# Patient Record
Sex: Female | Born: 1960 | Race: White | Hispanic: No | Marital: Single | Smoking: Light tobacco smoker
Health system: Southern US, Community
[De-identification: ages and names within clinical notes are randomized; demographics above are authoritative.]

## PROBLEM LIST (undated history)

## (undated) DIAGNOSIS — M5136 Other intervertebral disc degeneration, lumbar region: Secondary | ICD-10-CM

## (undated) DIAGNOSIS — G8929 Other chronic pain: Secondary | ICD-10-CM

## (undated) DIAGNOSIS — K219 Gastro-esophageal reflux disease without esophagitis: Secondary | ICD-10-CM

## (undated) DIAGNOSIS — F411 Generalized anxiety disorder: Secondary | ICD-10-CM

## (undated) DIAGNOSIS — Z8679 Personal history of other diseases of the circulatory system: Secondary | ICD-10-CM

## (undated) DIAGNOSIS — M51369 Other intervertebral disc degeneration, lumbar region without mention of lumbar back pain or lower extremity pain: Secondary | ICD-10-CM

## (undated) DIAGNOSIS — E119 Type 2 diabetes mellitus without complications: Secondary | ICD-10-CM

## (undated) DIAGNOSIS — M7541 Impingement syndrome of right shoulder: Secondary | ICD-10-CM

## (undated) DIAGNOSIS — E785 Hyperlipidemia, unspecified: Secondary | ICD-10-CM

## (undated) DIAGNOSIS — M2550 Pain in unspecified joint: Secondary | ICD-10-CM

## (undated) DIAGNOSIS — Z720 Tobacco use: Secondary | ICD-10-CM

## (undated) DIAGNOSIS — M25562 Pain in left knee: Secondary | ICD-10-CM

## (undated) DIAGNOSIS — I10 Essential (primary) hypertension: Secondary | ICD-10-CM

## (undated) DIAGNOSIS — K589 Irritable bowel syndrome without diarrhea: Secondary | ICD-10-CM

## (undated) DIAGNOSIS — I872 Venous insufficiency (chronic) (peripheral): Secondary | ICD-10-CM

## (undated) DIAGNOSIS — M542 Cervicalgia: Secondary | ICD-10-CM

## (undated) DIAGNOSIS — Z8701 Personal history of pneumonia (recurrent): Secondary | ICD-10-CM

## (undated) DIAGNOSIS — Z1211 Encounter for screening for malignant neoplasm of colon: Secondary | ICD-10-CM

## (undated) DIAGNOSIS — G894 Chronic pain syndrome: Secondary | ICD-10-CM

## (undated) HISTORY — DX: Other chronic pain: G89.29

## (undated) HISTORY — DX: Generalized anxiety disorder: F41.1

## (undated) HISTORY — DX: Other intervertebral disc degeneration, lumbar region without mention of lumbar back pain or lower extremity pain: M51.369

## (undated) HISTORY — DX: Tobacco use: Z72.0

## (undated) HISTORY — PX: COLONOSCOPY: SHX174

## (undated) HISTORY — DX: Type 2 diabetes mellitus without complications: E11.9

## (undated) HISTORY — DX: Essential (primary) hypertension: I10

## (undated) HISTORY — DX: Cervicalgia: M54.2

## (undated) HISTORY — DX: Irritable bowel syndrome, unspecified: K58.9

## (undated) HISTORY — DX: Impingement syndrome of right shoulder: M75.41

## (undated) HISTORY — DX: Chronic pain syndrome: G89.4

## (undated) HISTORY — DX: Venous insufficiency (chronic) (peripheral): I87.2

## (undated) HISTORY — DX: Personal history of other diseases of the circulatory system: Z86.79

## (undated) HISTORY — DX: Encounter for screening for malignant neoplasm of colon: Z12.11

## (undated) HISTORY — DX: Other intervertebral disc degeneration, lumbar region: M51.36

## (undated) HISTORY — DX: Personal history of pneumonia (recurrent): Z87.01

## (undated) HISTORY — DX: Pain in unspecified joint: M25.50

## (undated) HISTORY — DX: Gastro-esophageal reflux disease without esophagitis: K21.9

## (undated) HISTORY — DX: Pain in left knee: M25.562

## (undated) HISTORY — PX: ESOPHAGOGASTRODUODENOSCOPY: SHX1529

## (undated) HISTORY — DX: Hyperlipidemia, unspecified: E78.5

---

## 1991-04-26 HISTORY — PX: OVARY SURGERY: SHX727

## 2001-04-25 HISTORY — PX: VAGINAL HYSTERECTOMY: SHX2639

## 2003-09-23 ENCOUNTER — Other Ambulatory Visit: Admission: RE | Admit: 2003-09-23 | Discharge: 2003-09-23 | Payer: Self-pay | Admitting: Obstetrics and Gynecology

## 2003-10-01 ENCOUNTER — Encounter: Admission: RE | Admit: 2003-10-01 | Discharge: 2003-10-01 | Payer: Self-pay | Admitting: Obstetrics and Gynecology

## 2006-01-17 ENCOUNTER — Ambulatory Visit: Payer: Self-pay | Admitting: Gastroenterology

## 2006-01-25 ENCOUNTER — Ambulatory Visit: Payer: Self-pay | Admitting: Gastroenterology

## 2006-03-10 ENCOUNTER — Encounter (INDEPENDENT_AMBULATORY_CARE_PROVIDER_SITE_OTHER): Payer: Self-pay | Admitting: *Deleted

## 2006-03-10 ENCOUNTER — Ambulatory Visit: Payer: Self-pay | Admitting: Gastroenterology

## 2006-04-11 ENCOUNTER — Ambulatory Visit: Payer: Self-pay | Admitting: Gastroenterology

## 2008-04-25 DIAGNOSIS — Z8701 Personal history of pneumonia (recurrent): Secondary | ICD-10-CM

## 2008-04-25 HISTORY — DX: Personal history of pneumonia (recurrent): Z87.01

## 2009-02-02 ENCOUNTER — Emergency Department (HOSPITAL_COMMUNITY): Admission: EM | Admit: 2009-02-02 | Discharge: 2009-02-03 | Payer: Self-pay | Admitting: Emergency Medicine

## 2009-03-14 ENCOUNTER — Encounter: Payer: Self-pay | Admitting: Pulmonary Disease

## 2009-03-16 ENCOUNTER — Emergency Department (HOSPITAL_COMMUNITY): Admission: EM | Admit: 2009-03-16 | Discharge: 2009-03-16 | Payer: Self-pay | Admitting: Emergency Medicine

## 2009-03-17 ENCOUNTER — Ambulatory Visit: Payer: Self-pay | Admitting: Pulmonary Disease

## 2009-03-17 DIAGNOSIS — J309 Allergic rhinitis, unspecified: Secondary | ICD-10-CM | POA: Insufficient documentation

## 2009-04-01 ENCOUNTER — Ambulatory Visit: Payer: Self-pay | Admitting: Pulmonary Disease

## 2009-04-08 LAB — CONVERTED CEMR LAB: Pap Smear: NORMAL

## 2009-04-25 HISTORY — PX: CHOLECYSTECTOMY: SHX55

## 2009-05-14 ENCOUNTER — Ambulatory Visit: Payer: Self-pay | Admitting: Internal Medicine

## 2009-05-14 DIAGNOSIS — K219 Gastro-esophageal reflux disease without esophagitis: Secondary | ICD-10-CM | POA: Insufficient documentation

## 2010-05-25 NOTE — Assessment & Plan Note (Signed)
Summary: new / bcbs / #/ cd   Primary Care Provider:  Etta Grandchild MD   History of Present Illness: New to me she complains of nasal allergies.  Dyspepsia History:      She has no alarm features of dyspepsia including no history of melena, hematochezia, dysphagia, persistent vomiting, or involuntary weight loss > 5%.  There is a prior history of GERD.  The patient does not have a prior history of documented ulcer disease.  The dominant symptom is heartburn or acid reflux.  An H-2 blocker medication is currently being taken.  She notes that the symptoms have improved with the H-2 blocker therapy.  Symptoms have not persisted after 4 weeks of H-2 blocker treatment.  A prior EGD has been done which showed moderate or severe esophagitis.     Preventive Screening-Counseling & Management  Alcohol-Tobacco     Alcohol drinks/day: <1     Alcohol type: wine     >5/day in last 3 mos: no     Alcohol Counseling: not indicated; use of alcohol is not excessive or problematic     Feels need to cut down: no     Feels annoyed by complaints: no     Feels guilty re: drinking: no     Needs 'eye opener' in am: no     Smoking Status: current     Smoking Cessation Counseling: yes     Smoke Cessation Stage: ready     Packs/Day: 0.25     Year Started: 1980     Pack years: 30  Caffeine-Diet-Exercise     Does Patient Exercise: yes  Hep-HIV-STD-Contraception     Hepatitis Risk: no risk noted     HIV Risk: no risk noted     STD Risk: no risk noted      Drug Use:  no.    Medications Prior to Update: 1)  Alprazolam 0.25 Mg Tabs (Alprazolam) .... As Needed 2)  Vitamin B6 and B12 .... Take 1 Tablet By Mouth Once A Day 3)  Vitamin D .... Take 1 Tablet By Mouth Once A Day 4)  Multivitamin .... Take 1 Tablet By Mouth Once A Day 5)  Chantix Starting Month Pak 0.5 Mg X 11 & 1 Mg X 42  Misc (Varenicline Tartrate) .... Take As Directed---Refills Are To Be For The Continuing Pak  Current Medications  (verified): 1)  Alprazolam 0.25 Mg Tabs (Alprazolam) .... As Needed 2)  Vitamin B6 and B12 .... Take 1 Tablet By Mouth Once A Day 3)  Vitamin D .... Take 1 Tablet By Mouth Once A Day 4)  Multivitamin .... Take 1 Tablet By Mouth Once A Day 5)  Chantix Starting Month Pak 0.5 Mg X 11 & 1 Mg X 42  Misc (Varenicline Tartrate) .... Take As Directed---Refills Are To Be For The Continuing Pak 6)  Nasacort Aq 55 Mcg/act Aers (Triamcinolone Acetonide(Nasal)) .... 2 Puffs Each Nostril Once Daily  Allergies (verified): No Known Drug Allergies  Past History:  Past Medical History: Allergic Rhinitis GERD  Family History: Reviewed history from 03/17/2009 and no changes required. Rheumatism: mother Family History of Alcoholism/Addiction Family History of Arthritis  Social History: Reviewed history from 03/17/2009 and no changes required. Patient is a current smoker.  started at age 36.  1/2 ppd. Pt is currently on a patch . pt is single. pt works in Airline pilot.  Alcohol use-no Drug use-no Regular exercise-yes Packs/Day:  0.25 Hepatitis Risk:  no risk noted HIV Risk:  no risk noted STD Risk:  no risk noted Drug Use:  no Does Patient Exercise:  yes  Review of Systems  The patient denies fever, chest pain, prolonged cough, headaches, hemoptysis, abdominal pain, suspicious skin lesions, enlarged lymph nodes, and angioedema.   ENT:  Complains of nasal congestion and postnasal drainage; denies difficulty swallowing, ear discharge, earache, ringing in ears, sinus pressure, and sore throat.  Physical Exam  General:  alert, well-developed, well-nourished, well-hydrated, and overweight-appearing.   Head:  normocephalic, atraumatic, no abnormalities observed, and no abnormalities palpated.   Eyes:  vision grossly intact, pupils equal, pupils round, and pupils reactive to light.   Ears:  R ear normal and L ear normal.   Nose:  nasal discharge, mucosal erythema, and mucosal edema.  no external  deformity, no airflow obstruction, no intranasal foreign body, no nasal polyps, no nasal mucosal lesions, no mucosal friability, no active bleeding or clots, no sinus percussion tenderness, no septum abnormalities, nasal dischargemucosal pallor, mucosal erythema, and mucosal edema.   Mouth:  Oral mucosa and oropharynx without lesions or exudates.  Teeth in good repair. Neck:  No deformities, masses, or tenderness noted. Lungs:  Normal respiratory effort, chest expands symmetrically. Lungs are clear to auscultation, no crackles or wheezes. Heart:  Normal rate and regular rhythm. S1 and S2 normal without gallop, murmur, click, rub or other extra sounds. Abdomen:  Bowel sounds positive,abdomen soft and non-tender without masses, organomegaly or hernias noted. Msk:  normal ROM, no joint tenderness, no joint swelling, no joint warmth, and no redness over joints.   Pulses:  R and L carotid,radial,femoral,dorsalis pedis and posterior tibial pulses are full and equal bilaterally Extremities:  No clubbing, cyanosis, edema, or deformity noted with normal full range of motion of all joints.   Neurologic:  No cranial nerve deficits noted. Station and gait are normal. Plantar reflexes are down-going bilaterally. DTRs are symmetrical throughout. Sensory, motor and coordinative functions appear intact. Skin:  turgor normal, no rashes, no suspicious lesions, no ecchymoses, no petechiae, no purpura, no ulcerations, no edema, excessive tan, and solar damage.   Cervical Nodes:  no anterior cervical adenopathy and no posterior cervical adenopathy.   Axillary Nodes:  no R axillary adenopathy and no L axillary adenopathy.   Inguinal Nodes:  no R inguinal adenopathy and no L inguinal adenopathy.   Psych:  Cognition and judgment appear intact. Alert and cooperative with normal attention span and concentration. No apparent delusions, illusions, hallucinations   Impression & Recommendations:  Problem # 1:  GERD  (ICD-530.81) Assessment Improved  Her updated medication list for this problem includes:    Protonix 40 Mg Tbec (Pantoprazole sodium) ..... One by mouth once daily for heartburn  Problem # 2:  ALLERGIC RHINITIS (ICD-477.9) Assessment: Deteriorated  Her updated medication list for this problem includes:    Nasacort Aq 55 Mcg/act Aers (Triamcinolone acetonide(nasal)) .Marland Kitchen... 2 puffs each nostril once daily  Complete Medication List: 1)  Alprazolam 0.25 Mg Tabs (Alprazolam) .... As needed 2)  Vitamin B6 and B12  .... Take 1 tablet by mouth once a day 3)  Vitamin D  .... Take 1 tablet by mouth once a day 4)  Multivitamin  .... Take 1 tablet by mouth once a day 5)  Chantix Starting Month Pak 0.5 Mg X 11 & 1 Mg X 42 Misc (Varenicline tartrate) .... Take as directed---refills are to be for the continuing pak 6)  Nasacort Aq 55 Mcg/act Aers (Triamcinolone acetonide(nasal)) .... 2 puffs each nostril once  daily 7)  Protonix 40 Mg Tbec (Pantoprazole sodium) .... One by mouth once daily for heartburn  Colorectal Screening:  Colonoscopy Results:    Date of Exam: 06/09/2004    Results: Normal  PAP Screening:    Hx Cervical Dysplasia in last 5 yrs? No    3 normal PAP smears in last 5 yrs? Yes    Last PAP smear:  04/08/2009  PAP Smear Results:    Date of Exam:  04/08/2009    Results:  Normal  Mammogram Screening:    Last Mammogram:  04/08/2009  Mammogram Results:    Date of Exam:  04/08/2009    Results:  Normal Bilateral  Osteoporosis Risk Assessment:  Risk Factors for Fracture or Low Bone Density:   Race (White or Asian):     yes   Smoking status:       current  Patient Instructions: 1)  Please schedule a follow-up appointment as needed. 2)  Tobacco is very bad for your health and your loved ones! You Should stop smoking!. 3)  Stop Smoking Tips: Choose a Quit date. Cut down before the Quit date. decide what you will do as a substitute when you feel the urge to  smoke(gum,toothpick,exercise). 4)  It is important that you exercise regularly at least 20 minutes 5 times a week. If you develop chest pain, have severe difficulty breathing, or feel very tired , stop exercising immediately and seek medical attention. 5)  You need to lose weight. Consider a lower calorie diet and regular exercise.  Prescriptions: PROTONIX 40 MG TBEC (PANTOPRAZOLE SODIUM) one by mouth once daily for heartburn  #30 x 11   Entered and Authorized by:   Etta Grandchild MD   Signed by:   Etta Grandchild MD on 05/14/2009   Method used:   Print then Give to Patient   RxID:   [id number] NASACORT AQ 55 MCG/ACT AERS (TRIAMCINOLONE ACETONIDE(NASAL)) 2 puffs each nostril once daily  #1 bottle x 11   Entered and Authorized by:   Etta Grandchild MD   Signed by:   Etta Grandchild MD on 05/14/2009   Method used:   Print then Give to Patient   RxID:   [id number]

## 2010-07-28 LAB — BASIC METABOLIC PANEL
BUN: 12 mg/dL (ref 6–23)
Chloride: 109 mEq/L (ref 96–112)
Creatinine, Ser: 0.58 mg/dL (ref 0.4–1.2)
GFR calc non Af Amer: >60 mL/min (ref >60)

## 2010-07-28 LAB — CBC
MCV: 91.4 fL (ref 78.0–100.0)
Platelets: 318 10*3/uL (ref 150–400)
WBC: 9.3 10*3/uL (ref 4.0–10.5)

## 2010-07-28 LAB — DIFFERENTIAL
Basophils Absolute: 0 10*3/uL (ref 0.0–0.1)
Eosinophils Absolute: 0.3 10*3/uL (ref 0.0–0.7)
Lymphs Abs: 3.9 10*3/uL (ref 0.7–4.0)
Neutrophils Relative %: 51 % (ref 43–77)

## 2010-07-29 LAB — DIFFERENTIAL
Basophils Absolute: 0.1 10*3/uL (ref 0.0–0.1)
Basophils Relative: 1 % (ref 0–1)
Eosinophils Absolute: 0.2 10*3/uL (ref 0.0–0.7)
Eosinophils Relative: 1 % (ref 0–5)
Monocytes Absolute: 0.5 10*3/uL (ref 0.1–1.0)

## 2010-07-29 LAB — COMPREHENSIVE METABOLIC PANEL
ALT: 29 U/L (ref 0–35)
AST: 23 U/L (ref 0–37)
Alkaline Phosphatase: 77 U/L (ref 39–117)
CO2: 26 mEq/L (ref 19–32)
Chloride: 107 mEq/L (ref 96–112)
Creatinine, Ser: 0.66 mg/dL (ref 0.4–1.2)
GFR calc Af Amer: >60 mL/min (ref >60)
GFR calc non Af Amer: >60 mL/min (ref >60)
Potassium: 3.7 mEq/L (ref 3.5–5.1)
Sodium: 139 mEq/L (ref 135–145)
Total Bilirubin: 0.2 mg/dL — ABNORMAL LOW (ref 0.3–1.2)

## 2010-07-29 LAB — CBC
MCV: 91.2 fL (ref 78.0–100.0)
RBC: 4.39 MIL/uL (ref 3.87–5.11)
WBC: 12.7 10*3/uL — ABNORMAL HIGH (ref 4.0–10.5)

## 2010-07-29 LAB — LIPASE, BLOOD: Lipase: 28 U/L (ref 11–59)

## 2010-08-25 IMAGING — CR DG CHEST 2V
2 series · 2 of 2 positions shown · non-contrast
Comparison: None.

CLINICAL DATA: Hemoptysis, cough.

CHEST - 2 VIEW

[view not recorded (1 of 2)]
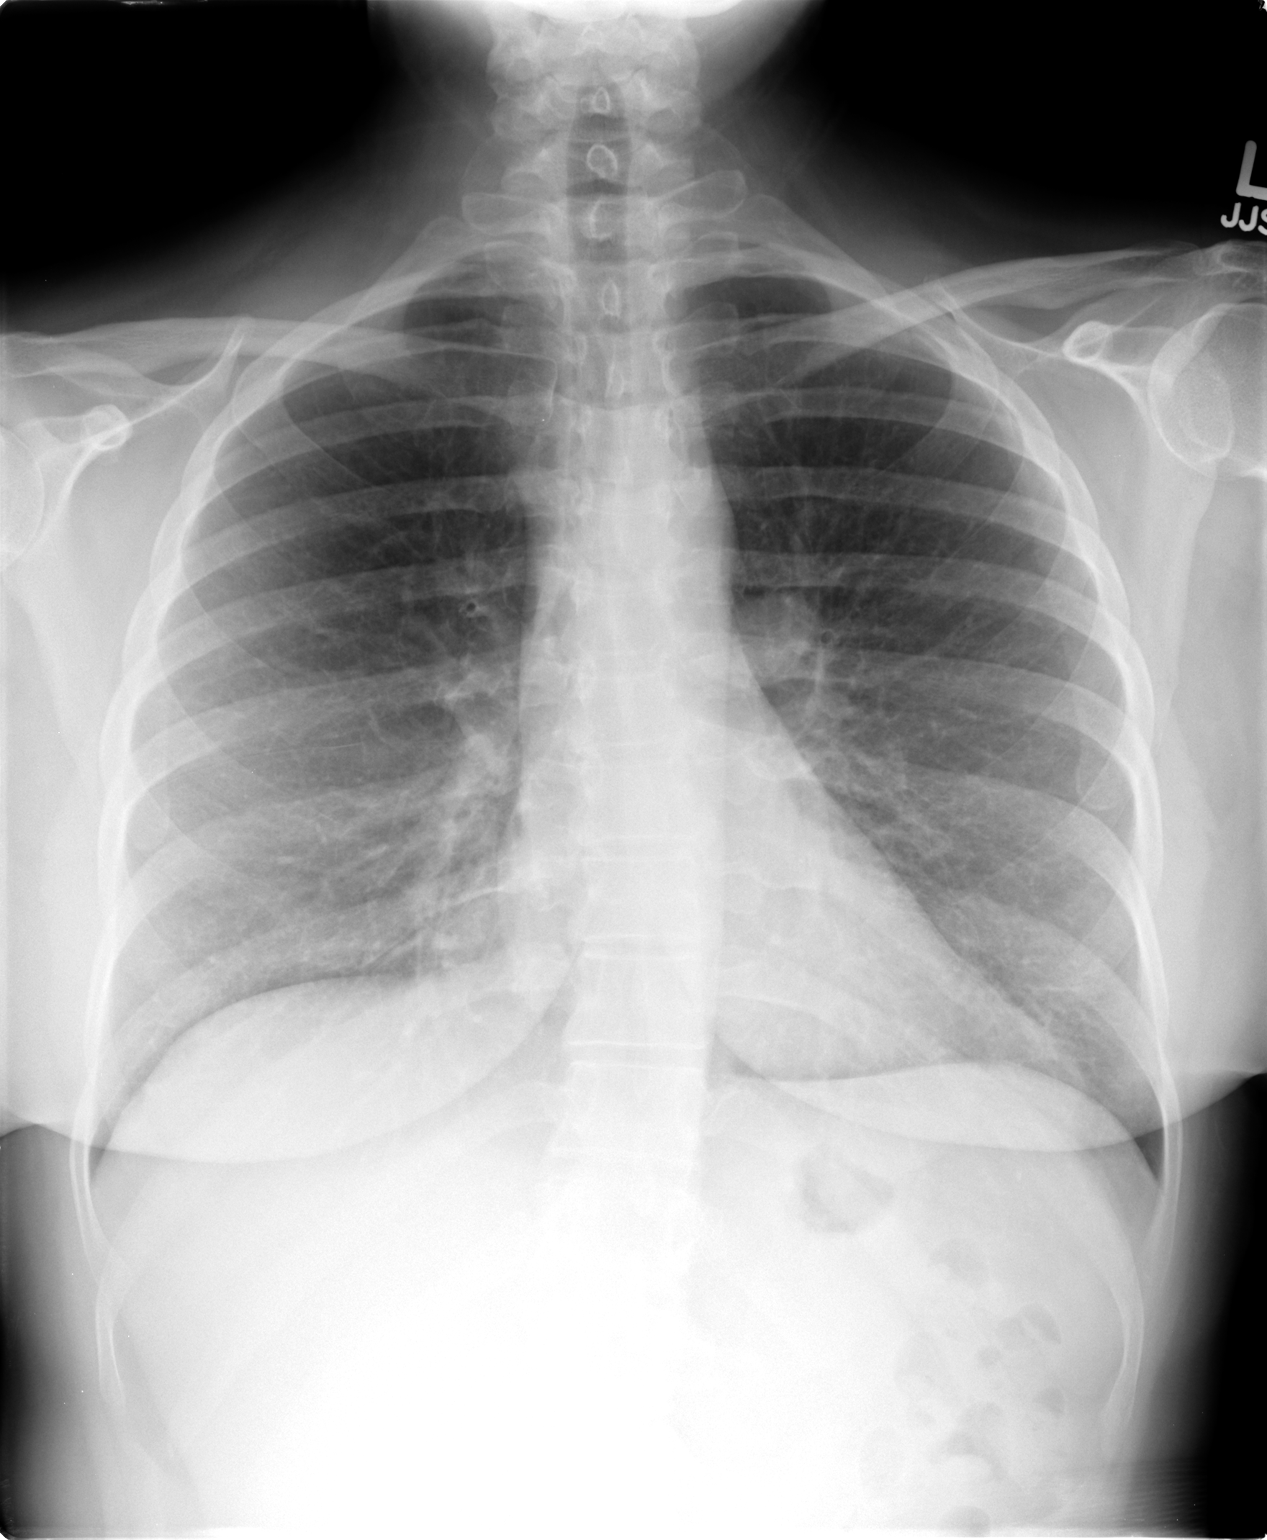

[view not recorded (2 of 2)]
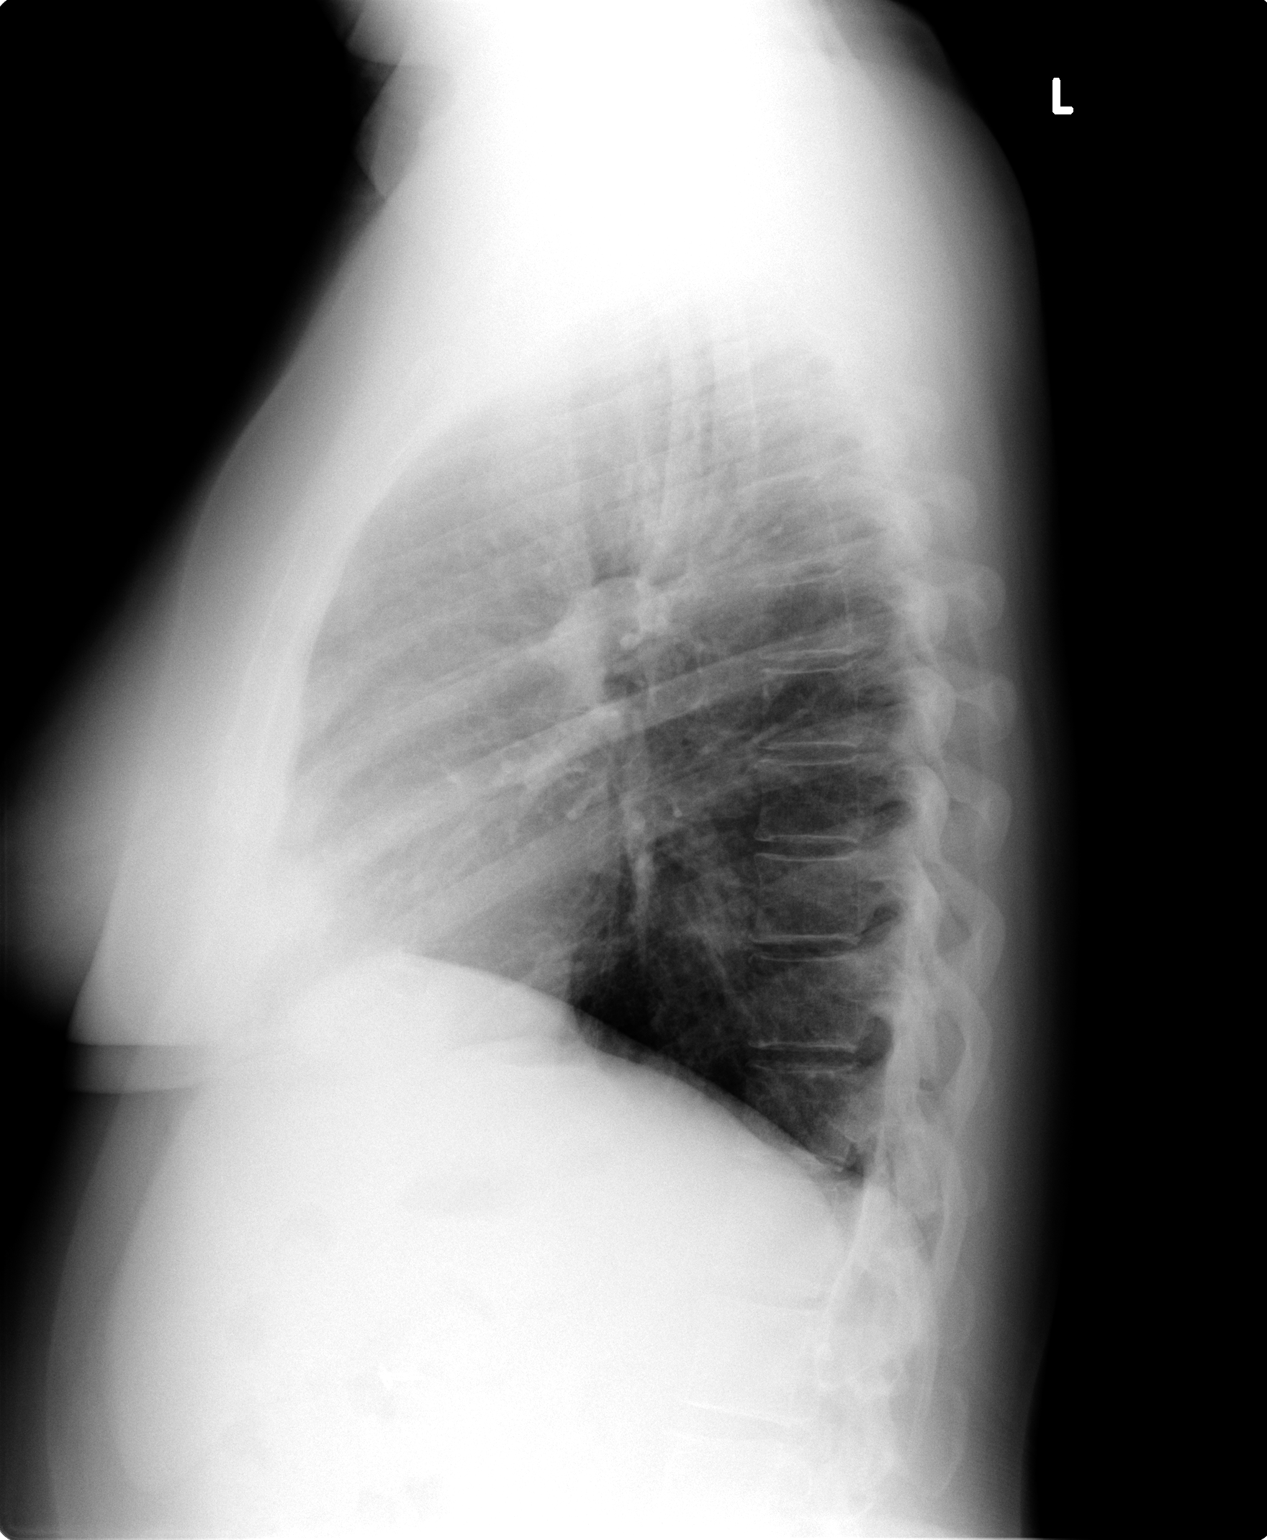

[2 of 2 positions shown; findings below may reference images not displayed]

FINDINGS: Trachea is midline.  Heart size normal.  Lungs are clear.
No pleural fluid.
IMPRESSION: No acute findings.

## 2010-09-10 NOTE — Assessment & Plan Note (Signed)
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 Henderson Cloud for evaluation of chest pain and  hematochezia.   This patient has had a year of acid reflux symptoms with burning, substernal  chest pain occasionally radiating into her back and to her chest but no  associated dysphagia or odynophagia or specific hepatobiliary complaints, or  systemic complaints.  She has not had x-rays or ultrasound exams.  She has a  very strong family history of gallbladder disease in her father and her  mother.   Patient has noticed that her burning substernal chest pain is relieved by  taking Protonix, but she has not used this on a regular basis.  She will  occasionally be awakened from sleep with chest pain and is relieved by  taking a PPI.  She has had no clay-colored stools, dark urine, icterus,  fevers or chills.  She does have diarrhea with fried fatty foods and  occasional bright red blood per rectum and apparently had positive Hemoccult  cards at Dr. Huel Coventry office.  She has gained 15 pounds in the last year,  and apparently she has had her thyroid functions checked.  They were normal.  She has not had previous colonoscopy or barium enema exams.  She otherwise  denies any specific food intolerances.  She gives no history of hepatitis,  pancreatitis, or known hepatobiliary problems.   Her past medical history is otherwise noncontributory.  She really denies  chronic medical problems but will occasionally take Aleve for headaches.  The only medications at this time are a medication called Ali t.i.d., p.r.n.  Protonix, black cohosh, and multivitamins.  She denies drug allergies.   Family history is  remarkable for alcoholism in her father and brother.  There is also a family history of thyroid disorder, diabetes, as mentioned  above, cholelithiasis in her father and sister.   SOCIAL HISTORY:  She is single and lives by herself.  She has a high school  education.  She does smoke and uses ethanol socially.  She denies problems  with previous drug abuse or IV needle use.   REVIEW OF SYSTEMS:  Remarkable for total abdominal hysterectomy with  oophorectomy apparently in June, 2003.  She complains of chronic insomnia,  night sweats, occasional edema of her lower extremities.  She otherwise  denies any cardiovascular, pulmonary, genitourinary, neurologic, orthopedic,  or endocrine problems.  Review of systems otherwise noncontributory.   PHYSICAL EXAMINATION:  GENERAL:  Exam shows her to be a healthy-appearing  white female in no distress, appearing her stated age.  VITAL SIGNS:  She is 5 feet 1 inches tall and weighs 169 pounds.  Blood  pressure 114/82.  Pulse was 60 and regular.  NECK:  I could not appreciate stigmata of chronic liver disease or  thyromegaly; however, her thyroid  was palpable.  CHEST:  Clear anteriorly or posteriorly.  She appeared to be in a regular  rhythm.  I could not appreciate a significant murmur, rub or gallop.  ABDOMEN:  No hepatosplenomegaly, masses, or tenderness.  Bowel sounds were  normal.  EXTREMITIES:  Peripheral extremities were unremarkable.  MENTAL STATUS:  Normal.  RECTAL:  Deferred.   ASSESSMENT:  1. Rather classic gastroesophageal reflux disease and probable hiatal      hernia.  2. Rule out cholelithiasis per symptomatology and strong family history.  3. Diarrhea, predominantly irritable bowel syndrome with greasy foods.  4. Rectal bleeding with guaiac positive stools, rule out colon polyps.  5. History of chronic cigarette abuse.  6. Status post total abdominal hysterectomy and unilateral oophorectomy.   RECOMMENDATIONS:  1. Reflux regime  reviewed with patient in detail.  I have let her see our      patient education movie concerning acid reflux.  2. I have urged her to take daily Protonix 40 mg for the first meal of the      day and twice a day if needed, again, before supper.  3. The usual antireflux dietary maneuvers.  4. Check outpatient endoscopy and ultrasonography.  5. Outpatient colonoscopy for history of rectal bleeding to rule out colon      polyps.  6. Continue other medications per Dr. Henderson Cloud.  7. Will check basic laboratory parameters today, including CBC and      metabolic profile.                                   Vania Rea. Jarold Motto, MD, [facility] [place], [place]   DRP/MedQ  DD:  01/17/2006  DT:  01/18/2006  Job #:  [id number]   cc:   Guy Sandifer. Henderson Cloud, M.D.

## 2011-08-02 ENCOUNTER — Encounter: Payer: Self-pay | Admitting: Family Medicine

## 2011-08-02 ENCOUNTER — Ambulatory Visit (INDEPENDENT_AMBULATORY_CARE_PROVIDER_SITE_OTHER): Payer: BC Managed Care – PPO | Admitting: Family Medicine

## 2011-08-02 VITALS — BP 119/81 | HR 102 | Temp 99.5°F | Ht 61.5 in | Wt 191.0 lb

## 2011-08-02 DIAGNOSIS — J189 Pneumonia, unspecified organism: Secondary | ICD-10-CM

## 2011-08-02 DIAGNOSIS — J019 Acute sinusitis, unspecified: Secondary | ICD-10-CM

## 2011-08-02 DIAGNOSIS — J209 Acute bronchitis, unspecified: Secondary | ICD-10-CM

## 2011-08-02 MED ORDER — PREDNISONE 20 MG PO TABS: ORAL_TABLET | ORAL | Status: DC

## 2011-08-02 MED ORDER — ALBUTEROL SULFATE HFA 108 (90 BASE) MCG/ACT IN AERS: 2.0000 | INHALATION_SPRAY | Freq: Four times a day (QID) | RESPIRATORY_TRACT | Status: DC | PRN

## 2011-08-02 MED ORDER — HYDROCODONE-HOMATROPINE 5-1.5 MG/5ML PO SYRP: ORAL_SOLUTION | ORAL | Status: AC

## 2011-08-02 MED ORDER — AZITHROMYCIN 250 MG PO TABS: ORAL_TABLET | ORAL | Status: DC

## 2011-08-02 MED ORDER — METHYLPREDNISOLONE ACETATE 40 MG/ML IJ SUSP
40.0000 mg | Freq: Once | INTRAMUSCULAR | Status: AC
  Administered 2011-08-02: 40 mg via INTRAMUSCULAR

## 2011-08-02 MED ORDER — CEFTRIAXONE SODIUM 1 G IJ SOLR
1.0000 g | Freq: Once | INTRAMUSCULAR | Status: AC
  Administered 2011-08-02: 1 g via INTRAMUSCULAR

## 2011-08-02 NOTE — Progress Notes (Signed)
Office Note 08/03/2011  CC:  Chief Complaint  Patient presents with  . Establish Care    congestion, "achey all over"    HPI:  [name] is a 51 y.o. White female who is here to transfer care due to living near to our office: used to see Dr. Yetta Barre at [facility] but last visit was about 2 yrs ago.  Has seen Urgent care MD occasionally since then, also has GYN -Dr. Henderson Cloud.  Pt presents complaining of respiratory symptoms for 14+ days.  Mostly nasal congestion/runny nose, sneezing, and PND cough.  Worst symptoms seems to be the persistence of pND, cough, some wheezing when she lies down at night, and last few days has had bad diffuse body aches, subjective f/c last night.  Lately the symptoms seem to be worsening. No SOB  No pain in face or teeth.  + HA.  ST mild at most.  Symptoms made worse by activity.  Symptoms improved by nothing.  She has tried some mucinex, flonase, and a vicodin she had leftover from an urgent Care rx for a previous shoulder problem. Smoker? yes Recent sick contact? yes Muscle or joint aches? Yes (diffuse) Flu shot this season at least 2 wks ago? no  ROS: no n/v/d or abdominal pain.  No rash.  No neck stiffness.   +Mild fatigue.  +Mild appetite loss.    Past Medical History  Diagnosis Date  . Tobacco abuse     30+ pack-yr hx  . History of pneumonia 2010    with hemoptysis; Pulm eval done, f/u CXR 04/01/09 after pneumonia was normal.  . GERD (gastroesophageal reflux disease)   . History of rheumatic fever     childhood--she reports no CV or Renal sequelae    Past Surgical History  Procedure Date  . Cholecystectomy 2011  . Vaginal hysterectomy 2003    Nonmalignant reasons (DUB)  . Ovary surgery 1993    left (ovarian cyst)    No family history on file.  History   Social History  . Marital Status: Married    Spouse Name: N/A    Number of Children: N/A  . Years of Education: N/A   Occupational History  . Not on file.    Social History Main Topics  . Smoking status: Current Everyday Smoker    Types: Cigarettes  . Smokeless tobacco: Never Used  . Alcohol Use: Yes     occ wine  . Drug Use: No  . Sexually Active: Not on file   Other Topics Concern  . Not on file   Social History Narrative   Divorced, one 68 y/o son.Orig from [place], [place] and relocated to [facility] [place].Works in Airline pilot for [employer].Tobacco: 30 + yrs, 1 ppd.Rare glass of wine.No drug use, no exercise.Has a shitzu, parrot, and a parakeet.  Lives alone in [place].    MEDS: no chronic meds.  Also, see HPI.  No Known Allergies  ROS Review of Systems  Constitutional: Positive for fever and fatigue.  HENT: Positive for ear pain, congestion, rhinorrhea, sneezing, postnasal drip and sinus pressure. Negative for sore throat.   Eyes: Negative for pain, redness and visual disturbance.  Respiratory: Positive for cough, chest tightness and wheezing. Negative for shortness of breath.   Cardiovascular: Negative for chest pain.  Gastrointestinal: Negative for nausea and abdominal pain.  Genitourinary: Negative for dysuria.  Musculoskeletal: Positive for myalgias and arthralgias. Negative for joint swelling.  Skin: Negative for rash.  Neurological: Positive for headaches.  Weakness: generalized.  Hematological: Negative for adenopathy.  Psychiatric/Behavioral: Positive for sleep disturbance.    PE; Blood pressure 119/81, pulse 102, temperature 99.5 F (37.5 C), temperature source Temporal, height 5' 1.5" (1.562 m), weight 191 lb (86.637 kg), SpO2 97.00%. Gen: Alert, tired appearing, but nad.  Patient is oriented to person, place, time, and situation. VS: noted--normal. Gen: alert, NAD, NONTOXIC APPEARING. HEENT: eyes without injection, drainage, or swelling.  Ears: EACs clear, TMs with normal light reflex and landmarks.  Nose: Clear rhinorrhea, with some dried, crusty exudate adherent to mildly injected mucosa.  No  purulent d/c.  No paranasal sinus TTP.  No facial swelling.  Throat and mouth without focal lesion.  No pharyngial swelling, erythema, or exudate.   Neck: supple, no LAD.   LUNGS: CTA bilat, nonlabored resps.  Question of mildly diminished aeration diffusely, BS equal.  NO prolonged exp phase, no wheezing, no post exhalational coughing. CV: RRR, no m/r/g. EXT: no c/c/e  No joint swelling or redness. SKIN: no rash   Pertinent labs:  none  ASSESSMENT AND PLAN:   Transfer pt: obtain appropriate old records.  Acute bronchitis Suspect acute sinusitis, bronchitis, and she has worsening of sx's worrisome for actual pneumonia vs bacterial form of bronchitis. Certainly needs to stop smoking, encouraged this today. May continue flonase qd and mucinex prn.   Add prednisone 40mg  qd x 5d.  Depo-medrol 40mg  IM given in office today. Rocephin 1g IM given in office today. Start z-pack tomorrow.  Hycodan susp, 1-2 tsp q6h prn severe cough was added today, #176ml, no RF.  Therapeutic expectations and side effect profile of medication discussed today.  Patient's questions answered.      FOLLOW UP:  Return in about 5 days (around 08/07/2011) for f/u bronchopneumonia.

## 2011-08-03 ENCOUNTER — Encounter: Payer: Self-pay | Admitting: Family Medicine

## 2011-08-03 NOTE — Assessment & Plan Note (Signed)
Suspect acute sinusitis, bronchitis, and she has worsening of sx's worrisome for actual pneumonia vs bacterial form of bronchitis. Certainly needs to stop smoking, encouraged this today. May continue flonase qd and mucinex prn.   Add prednisone 40mg  qd x 5d.  Depo-medrol 40mg  IM given in office today. Rocephin 1g IM given in office today. Start z-pack tomorrow.  Hycodan susp, 1-2 tsp q6h prn severe cough was added today, #164ml, no RF.  Therapeutic expectations and side effect profile of medication discussed today.  Patient's questions answered.

## 2011-08-05 ENCOUNTER — Telehealth: Payer: Self-pay | Admitting: *Deleted

## 2011-08-05 NOTE — Telephone Encounter (Signed)
VM left by patient stating that she has started z-pak and continues to have fever of 101 last night.  Fever broke about 3 am.  RC to pt.  She is on lunch break.  Message left on mobile voicemail to return call.

## 2011-08-05 NOTE — Telephone Encounter (Signed)
PC to pt.  Advised to let z-pak work and keep appt on Monday.  Pt is agreeable.

## 2011-08-08 ENCOUNTER — Encounter: Payer: Self-pay | Admitting: Family Medicine

## 2011-08-08 ENCOUNTER — Ambulatory Visit (INDEPENDENT_AMBULATORY_CARE_PROVIDER_SITE_OTHER): Payer: BC Managed Care – PPO | Admitting: Family Medicine

## 2011-08-08 VITALS — BP 140/95 | HR 73 | Temp 98.0°F | Wt 191.0 lb

## 2011-08-08 DIAGNOSIS — J180 Bronchopneumonia, unspecified organism: Secondary | ICD-10-CM

## 2011-08-08 DIAGNOSIS — J309 Allergic rhinitis, unspecified: Secondary | ICD-10-CM

## 2011-08-08 MED ORDER — DESLORATADINE 5 MG PO TABS: 5.0000 mg | ORAL_TABLET | Freq: Every day | ORAL | Status: DC

## 2011-08-08 NOTE — Progress Notes (Signed)
OFFICE NOTE  08/08/2011  CC:  Chief Complaint  Patient presents with  . Follow-up    pneumonia     HPI: Patient is a 51 y.o. Caucasian female who is here for 6d f/u recent bronchopneumonia (clinical dx). Cough is improved, breathing feels better, back not hurting anymore, fevers resolved.  Has vague feeling of being keyed up today, "like I've just done a bunch of caffeine or speed or something".  Denies use of OTC cold med.  Hasn't even used inhaler inhaler today.  Finished prednisone and zithromax yesterday. Had two loose stools today and feels a bit nauseous.  Says she feels some PND from allergic rhinitis and this usually makes her feel nauseated.  Denies pain with urination, urinary urgency/frequency, or hematuria. No chest pain or myalgias.  No rash.  No HA.  +slight "dizzy" feeling on/off throughout today.   Pertinent PMH:  Past Medical History  Diagnosis Date  . Tobacco abuse     30+ pack-yr hx  . History of pneumonia 2010    with hemoptysis; Pulm eval done, f/u CXR 04/01/09 after pneumonia was normal.  . GERD (gastroesophageal reflux disease)   . History of rheumatic fever     childhood--she reports no CV or Renal sequelae    MEDS:  Outpatient Prescriptions Prior to Visit  Medication Sig Dispense Refill  . albuterol (PROAIR HFA) 108 (90 BASE) MCG/ACT inhaler Inhale 2 puffs into the lungs every 6 (six) hours as needed for wheezing.  1 Inhaler  0  . guaiFENesin (MUCINEX) 600 MG 12 hr tablet Take 600 mg by mouth 2 (two) times daily.      . Hydrocodone-Acetaminophen (VICODIN PO) Take by mouth.      Marland Kitchen HYDROcodone-homatropine (HYCODAN) 5-1.5 MG/5ML syrup 1-2 tsp q6h prn cough  120 mL  0  . azithromycin (ZITHROMAX) 250 MG tablet 2 tabs po qd x 1d, then 1 tab po qd x 4d  6 each  0  . predniSONE (DELTASONE) 20 MG tablet 2 tabs po qd x 5d  10 tablet  0  Flonase 2 sprays each nostril qd  PE: Blood pressure 140/95, pulse 73, temperature 98 F (36.7 C), temperature source  Temporal, weight 191 lb (86.637 kg). Gen: Alert, well appearing.  Patient is oriented to person, place, time, and situation. ENT: Ears: EACs clear, normal epithelium.  TMs with good light reflex and landmarks bilaterally.  Eyes: no injection, icteris, swelling, or exudate.  EOMI, PERRLA. Nose: no drainage or turbinate edema/swelling.  No injection or focal lesion.  Mouth: lips without lesion/swelling.  Oral mucosa pink and moist.  Dentition intact and without obvious caries or gingival swelling.  Oropharynx without erythema, exudate, or swelling.  Neck - No masses or thyromegaly or limitation in range of motion CV: RRR, no m/r/g.   LUNGS: CTA bilat, nonlabored resps, good aeration in all lung fields. ABD: soft, NT, ND, BS normal.  No hepatospenomegaly or mass.  No bruits. EXT: no clubbing, cyanosis, or edema.    IMPRESSION AND PLAN:  Recent upper and lower resp tract infection: improving regarding resp sx's. Feelings today most likely related to side effects of recent meds (prednisone, azithromycin). She looks good today.  Reassured her, but asked her to call if sx's don't improve or if new sx's develop. Encouraged smoking cessation today. Added clarinex 5mg  qd to her flonase 2 sprays each nostril qd.  FOLLOW UP: prn

## 2011-09-05 ENCOUNTER — Other Ambulatory Visit: Payer: Self-pay | Admitting: *Deleted

## 2011-09-05 MED ORDER — ALPRAZOLAM 0.25 MG PO TABS: ORAL_TABLET | ORAL | Status: DC

## 2011-09-05 NOTE — Telephone Encounter (Signed)
I did rx for 0.25mg  tabs, take 1-2 bid prn, #60. I recommend she make o/v to discuss anxiety/nerves with me if she begins using this more often.  We may need to discuss a more appropriate long term approach to her treatment.

## 2011-09-05 NOTE — Telephone Encounter (Signed)
Vm left requesting refill on alprazolam.  Pt states she was previously on 0.25 mg and has not had this med filled since 2011.  She states she is using it more frequently to keep her out of trouble at work. RC to patient:  Originally prescribed by Dr. Floria Raveling daily as needed) Pt has been having filled at [facility].  Pt read instruction and quantity from bottle.  Per Rinaldo Cloud at pharmacy, last fill was 10/2007, #30 with sig above. She has been taking 2 daily the last few days to help her control what she says when people at work make her angry.  We have never seen patient for this. Please advise if OK to fill.

## 2011-09-05 NOTE — Telephone Encounter (Signed)
Pt notified.  She is agreeable.  RX faxed.

## 2011-09-06 NOTE — Telephone Encounter (Signed)
[facility] could not read faxed copy.  Verbal to Joe at [facility].

## 2011-09-09 ENCOUNTER — Encounter: Payer: Self-pay | Admitting: Family Medicine

## 2011-09-09 ENCOUNTER — Ambulatory Visit (INDEPENDENT_AMBULATORY_CARE_PROVIDER_SITE_OTHER): Payer: BC Managed Care – PPO | Admitting: Family Medicine

## 2011-09-09 VITALS — BP 132/83 | HR 88 | Ht 61.5 in | Wt 190.0 lb

## 2011-09-09 DIAGNOSIS — F411 Generalized anxiety disorder: Secondary | ICD-10-CM

## 2011-09-09 MED ORDER — CITALOPRAM HYDROBROMIDE 20 MG PO TABS: 20.0000 mg | ORAL_TABLET | Freq: Every day | ORAL | Status: DC
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
## 2011-09-09 NOTE — Assessment & Plan Note (Signed)
Start trial of citalopram 20mg  qd.  Therapeutic expectations and side effect profile of medication discussed today.  Patient's questions answered. May continue prn xanax as she is currently doing, with the expectation that this will soon become a rarely needed prn med. F/u in office 1 mo.

## 2011-10-07 ENCOUNTER — Ambulatory Visit (INDEPENDENT_AMBULATORY_CARE_PROVIDER_SITE_OTHER): Payer: BC Managed Care – PPO | Admitting: Family Medicine

## 2011-10-07 ENCOUNTER — Encounter: Payer: Self-pay | Admitting: Family Medicine

## 2011-10-07 VITALS — BP 138/83 | HR 69 | Ht 61.5 in | Wt 186.0 lb

## 2011-10-07 DIAGNOSIS — F411 Generalized anxiety disorder: Secondary | ICD-10-CM

## 2011-10-07 DIAGNOSIS — F17200 Nicotine dependence, unspecified, uncomplicated: Secondary | ICD-10-CM

## 2011-10-07 NOTE — Progress Notes (Signed)
OFFICE NOTE  10/07/2011  CC:  Chief Complaint  Patient presents with  . Follow-up    mood, anxiety; better on meds!     HPI: Patient is a 51 y.o. Caucasian female who is here for 1 mo f/u for GAD, started citalopram 27mo ago. Feeling improved, more calm.  Takes xanax only occasionally to help with sleep at night.   Pertinent PMH:  Past Medical History  Diagnosis Date  . Tobacco abuse     30+ pack-yr hx  . History of pneumonia 2010    with hemoptysis; Pulm eval done, f/u CXR 04/01/09 after pneumonia was normal.  . GERD (gastroesophageal reflux disease)   . History of rheumatic fever     childhood--she reports no CV or Renal sequelae    MEDS:  Outpatient Prescriptions Prior to Visit  Medication Sig Dispense Refill  . albuterol (PROAIR HFA) 108 (90 BASE) MCG/ACT inhaler Inhale 2 puffs into the lungs every 6 (six) hours as needed for wheezing.  1 Inhaler  0  . ALPRAZolam (XANAX) 0.25 MG tablet 1-2 tabs po bid prn anxiety  60 tablet  0  . citalopram (CELEXA) 20 MG tablet Take 1 tablet (20 mg total) by mouth daily.  30 tablet  1  . desloratadine (CLARINEX) 5 MG tablet Take 1 tablet (5 mg total) by mouth daily.  30 tablet  30  . Hydrocodone-Acetaminophen (VICODIN PO) Take by mouth.      Marland Kitchen aluminum chloride (DRYSOL) 20 % external solution Apply topically as needed.      Marland Kitchen guaiFENesin (MUCINEX) 600 MG 12 hr tablet Take 600 mg by mouth 2 (two) times daily.        PE: Blood pressure 138/83, pulse 69, height 5' 1.5" (1.562 m), weight 186 lb (84.369 kg). Wt Readings from Last 2 Encounters:  10/07/11 186 lb (84.369 kg)  09/09/11 190 lb (86.183 kg)  Gen: alert, oriented x 4, affect pleasant.  Lucid thinking and conversation noted. HEENT: PERRLA, EOMI.   Neck: no LAD, mass, or thyromegaly. CV: RRR, no m/r/g LUNGS: CTA bilat, nonlabored. NEURO: no tremor or tics noted on observation.  Coordination intact. CN 2-12 grossly intact bilaterally, strength 5/5 in all extremeties.  No  ataxia.   IMPRESSION AND PLAN:  GAD (generalized anxiety disorder) Much improved. Continue citalopram 20mg  qd and xanax 0.25mg  q8h prn (she uses very little of this). F/u in 2 mo.   Tobacco dependence: she is contemplating a trial of nicotine patches but is deterred by the price up-front. Chantix use in the past made her depressed, and I think wellbutrin trial would be to much of an "activating" type of med for her and therefore make her anxiety worse.    FOLLOW UP: 32mo

## 2011-10-09 NOTE — Assessment & Plan Note (Signed)
Much improved. Continue citalopram 20mg  qd and xanax 0.25mg  q8h prn (she uses very little of this). F/u in 2 mo.

## 2011-10-14 ENCOUNTER — Other Ambulatory Visit: Payer: Self-pay | Admitting: Family Medicine

## 2011-10-14 MED ORDER — FLUTICASONE PROPIONATE 0.05 % EX CREA: TOPICAL_CREAM | CUTANEOUS | Status: DC

## 2011-10-14 NOTE — Telephone Encounter (Signed)
Pt.notified

## 2011-10-14 NOTE — Telephone Encounter (Signed)
I sent in rx for generic cutivate cream.  Drysol was for something different.--thx

## 2011-10-14 NOTE — Telephone Encounter (Signed)
Pt is calling to get a stronger medicine for Eczema. States she was seen last week for problem and has another flare with blisters. She denies fever, or drainage. She does report blotchy areas  of redness. She uses [facility] on [employer]. Call back is [phone] they may say she is in training but if you say you are from doctor's office they will get her.

## 2011-10-14 NOTE — Telephone Encounter (Signed)
Pt was previously using drysol.  Please advise if stronger med is appropriate.

## 2011-11-15 ENCOUNTER — Other Ambulatory Visit: Payer: Self-pay | Admitting: *Deleted

## 2011-11-15 MED ORDER — CITALOPRAM HYDROBROMIDE 20 MG PO TABS: 20.0000 mg | ORAL_TABLET | Freq: Every day | ORAL | Status: DC

## 2011-11-15 MED ORDER — ALPRAZOLAM 0.25 MG PO TABS: ORAL_TABLET | ORAL | Status: DC

## 2011-11-15 NOTE — Telephone Encounter (Signed)
Refill request for celexa Last filled-09/05/11, #30 x 1 Last seen- 10/07/11 Follow up - 12/09/11 Will run out of meds before then.  RX sent.  Refill request for ALPRAZOLAM Last filled- 09/05/11, #60 X 0 Last seen- 10/07/11 Follow up - 12/09/11 Please advise refills.

## 2011-11-15 NOTE — Telephone Encounter (Signed)
RX faxed

## 2011-12-09 ENCOUNTER — Ambulatory Visit (INDEPENDENT_AMBULATORY_CARE_PROVIDER_SITE_OTHER): Payer: BC Managed Care – PPO | Admitting: Family Medicine

## 2011-12-09 ENCOUNTER — Encounter: Payer: Self-pay | Admitting: Family Medicine

## 2011-12-09 VITALS — BP 120/82 | HR 67 | Temp 97.8°F | Ht 61.5 in | Wt 195.0 lb

## 2011-12-09 DIAGNOSIS — L301 Dyshidrosis [pompholyx]: Secondary | ICD-10-CM

## 2011-12-09 DIAGNOSIS — F411 Generalized anxiety disorder: Secondary | ICD-10-CM

## 2011-12-09 MED ORDER — CITALOPRAM HYDROBROMIDE 20 MG PO TABS: 20.0000 mg | ORAL_TABLET | Freq: Every day | ORAL | Status: DC

## 2011-12-09 MED ORDER — FLUTICASONE PROPIONATE 0.05 % EX CREA: TOPICAL_CREAM | CUTANEOUS | Status: DC

## 2011-12-09 NOTE — Progress Notes (Signed)
OFFICE NOTE  12/12/2011  CC:  Chief Complaint  Patient presents with  . Follow-up    2 month- on depression     HPI: Patient is a 51 y.o. Caucasian female who is here for 2 mo f/u GAD.  Says she is doing well, "I'm working on getting along with everybody".  She is attending anger management via her employer still.  Denies side effect from meds.    She has chronic problem with very itchy little bumps/vesicles on hands and feet.  This has responded partially to use of cutivate 0.05% cream.  She is interested in seeing a dermatologist.   Pertinent PMH:  Past Medical History  Diagnosis Date  . Tobacco abuse     30+ pack-yr hx  . History of pneumonia 2010    with hemoptysis; Pulm eval done, f/u CXR 04/01/09 after pneumonia was normal.  . GERD (gastroesophageal reflux disease)   . History of rheumatic fever     childhood--she reports no CV or Renal sequelae  GAD/anger control problems. Eczema  MEDS:  Outpatient Prescriptions Prior to Visit  Medication Sig Dispense Refill  . albuterol (PROAIR HFA) 108 (90 BASE) MCG/ACT inhaler Inhale 2 puffs into the lungs every 6 (six) hours as needed for wheezing.  1 Inhaler  0  . ALPRAZolam (XANAX) 0.25 MG tablet 1-2 tabs po bid prn anxiety  60 tablet  0  . desloratadine (CLARINEX) 5 MG tablet Take 1 tablet (5 mg total) by mouth daily.  30 tablet  30  . citalopram (CELEXA) 20 MG tablet Take 1 tablet (20 mg total) by mouth daily.  30 tablet  0  . fluticasone (CUTIVATE) 0.05 % cream Apply to affected areas bid prn  30 g  1  . aluminum chloride (DRYSOL) 20 % external solution Apply topically as needed.      . Hydrocodone-Acetaminophen (VICODIN PO) Take by mouth.        PE: Blood pressure 120/82, pulse 67, temperature 97.8 F (36.6 C), temperature source Temporal, height 5' 1.5" (1.562 m), weight 195 lb (88.451 kg), SpO2 94.00%. Wt Readings from Last 2 Encounters:  12/09/11 195 lb (88.451 kg)  10/07/11 186 lb (84.369 kg)   Gen: alert,  oriented x 4, affect pleasant.  Lucid thinking and conversation noted. HEENT: PERRLA, EOMI.   Neck: no LAD, mass, or thyromegaly. CV: RRR, no m/r/g LUNGS: CTA bilat, nonlabored. NEURO: no tremor or tics noted on observation.  Coordination intact. CN 2-12 grossly intact bilaterally, strength 5/5 in all extremeties.  No ataxia. SKIN: hands with scattered excoriated papular rash, primarily on palms, feet with similar rash but mostly affecting the dorsal surface.  No petechiae, no pustules, no areas of erythema or tenderness.   IMPRESSION AND PLAN:  GAD (generalized anxiety disorder) With anger control problems. She is stable.  Continue current meds and anger control classes with via her employer.  Dyshidrotic eczema Continue current steroid cream and moisturizers. Refer to dermatologist.   FOLLOW UP: 6 mo

## 2011-12-12 DIAGNOSIS — L301 Dyshidrosis [pompholyx]: Secondary | ICD-10-CM | POA: Insufficient documentation

## 2011-12-12 NOTE — Assessment & Plan Note (Signed)
With anger control problems. She is stable.  Continue current meds and anger control classes with via her employer.

## 2011-12-12 NOTE — Assessment & Plan Note (Signed)
Continue current steroid cream and moisturizers. Refer to dermatologist.

## 2011-12-20 ENCOUNTER — Ambulatory Visit (INDEPENDENT_AMBULATORY_CARE_PROVIDER_SITE_OTHER): Payer: BC Managed Care – PPO | Admitting: Family Medicine

## 2011-12-20 VITALS — BP 127/85 | HR 73 | Temp 98.1°F | Resp 18 | Ht 61.25 in | Wt 196.8 lb

## 2011-12-20 DIAGNOSIS — R600 Localized edema: Secondary | ICD-10-CM

## 2011-12-20 DIAGNOSIS — R609 Edema, unspecified: Secondary | ICD-10-CM

## 2011-12-20 LAB — POCT URINALYSIS DIPSTICK
Glucose, UA: NEGATIVE
Ketones, UA: NEGATIVE
Leukocytes, UA: NEGATIVE
Protein, UA: NEGATIVE
Spec Grav, UA: 1.025
Urobilinogen, UA: 0.2

## 2011-12-20 MED ORDER — FUROSEMIDE 20 MG PO TABS: 20.0000 mg | ORAL_TABLET | Freq: Every day | ORAL | Status: DC | PRN

## 2011-12-20 NOTE — Patient Instructions (Addendum)
Thank you for coming in today. Please follow up with your primary doctor within one week. If you have issues you can always come back here. Take Lasix tomorrow morning as needed once a day.  It will make you pee a lot.  We are checking your kidneys.  Call or go to the emergency room if you get worse, have trouble breathing, have chest pains, or palpitations.
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
## 2011-12-21 LAB — COMPLETE METABOLIC PANEL WITH GFR
ALT: 45 U/L — ABNORMAL HIGH (ref 0–35)
AST: 21 U/L (ref 0–37)
BUN: 19 mg/dL (ref 6–23)
Creat: 0.64 mg/dL (ref 0.50–1.10)
GFR, Est African American: >89 mL/min
Total Bilirubin: 0.2 mg/dL — ABNORMAL LOW (ref 0.3–1.2)

## 2011-12-22 ENCOUNTER — Encounter: Payer: Self-pay | Admitting: Family Medicine

## 2012-01-24 DIAGNOSIS — I872 Venous insufficiency (chronic) (peripheral): Secondary | ICD-10-CM

## 2012-01-24 HISTORY — DX: Venous insufficiency (chronic) (peripheral): I87.2

## 2012-03-07 ENCOUNTER — Telehealth: Payer: Self-pay | Admitting: Family Medicine

## 2012-03-07 NOTE — Telephone Encounter (Signed)
Caller: [name]/Patient; Patient Name: [name]; PCP: Earley Favor [facility]); Best Callback Phone Number: [phone]. Sore Throat with white patches to right tonsil. Onset 03/06/12. Afebrile. All emergent symptoms per Sore Throat protocol ruled out. Advised to be seen within 4 hours per guidelines. No appointments available for today. OFFICE PLEASE FOLLOW UP WITH PATIENT REGARDING APPOINTMENT NEED FOR TODAY.

## 2012-03-07 NOTE — Telephone Encounter (Signed)
I spoke with pt.  She states she has post nasal drainage, no fever, a slight cough.  ST was bad enough earlier today that pt could barely talk.  She has not taken any Tylenol or ibuprofen.   Advised pt we do not have any appointments this afternoon, but we could see her at 8am tomorrow.  Pt will seek care at [facility] tonight if she develops fever or sore throat worsens.  She will cancel appt tomorrow if she seeks care elsewhere or symptoms improve.

## 2012-03-08 ENCOUNTER — Ambulatory Visit (INDEPENDENT_AMBULATORY_CARE_PROVIDER_SITE_OTHER): Payer: BC Managed Care – PPO | Admitting: Family Medicine

## 2012-03-08 ENCOUNTER — Encounter: Payer: Self-pay | Admitting: Family Medicine

## 2012-03-08 VITALS — BP 133/79 | HR 86 | Temp 99.6°F | Ht 61.5 in | Wt 201.0 lb

## 2012-03-08 DIAGNOSIS — J029 Acute pharyngitis, unspecified: Secondary | ICD-10-CM | POA: Insufficient documentation

## 2012-03-08 LAB — POCT RAPID STREP A (OFFICE): Rapid Strep A Screen: NEGATIVE

## 2012-03-08 NOTE — Assessment & Plan Note (Addendum)
Send group A strep probe. Symptomatic care discussed: salt water gargle, chloraseptic spray prn, max dosing of ibuprofen OR tylenol.

## 2012-03-08 NOTE — Progress Notes (Signed)
OFFICE NOTE  03/08/2012  CC: No chief complaint on file.    HPI: Patient is a 51 y.o. Caucasian female who is here for sore throat.  Onset a few days ago, worse in A.M. A little more "allergy sx's" lately, has been doing the leaves more lately.  Says she doesn't feel like she has a cold. No fever at home.  Fatigued, but has been working a lot.  No wheezing, chest pain, or SOB.  No rash.  Pertinent PMH:  Past Medical History  Diagnosis Date  . Tobacco abuse     30+ pack-yr hx  . History of pneumonia 2010    with hemoptysis; Pulm eval done, f/u CXR 04/01/09 after pneumonia was normal.  . GERD (gastroesophageal reflux disease)   . History of rheumatic fever     childhood--she reports no CV or Renal sequelae    MEDS:  Outpatient Prescriptions Prior to Visit  Medication Sig Dispense Refill  . albuterol (PROAIR HFA) 108 (90 BASE) MCG/ACT inhaler Inhale 2 puffs into the lungs every 6 (six) hours as needed for wheezing.  1 Inhaler  0  . ALPRAZolam (XANAX) 0.25 MG tablet 1-2 tabs po bid prn anxiety  60 tablet  0  . citalopram (CELEXA) 20 MG tablet Take 1 tablet (20 mg total) by mouth daily.  90 tablet  1  . desloratadine (CLARINEX) 5 MG tablet Take 1 tablet (5 mg total) by mouth daily.  30 tablet  30  . fluticasone (CUTIVATE) 0.05 % cream Apply to affected areas bid prn  90 g  1  . Hydrocodone-Acetaminophen (VICODIN PO) Take by mouth.      Marland Kitchen aluminum chloride (DRYSOL) 20 % external solution Apply topically as needed.      . furosemide (LASIX) 20 MG tablet Take 1 tablet (20 mg total) by mouth daily as needed.  10 tablet  0    PE: Blood pressure 133/79, pulse 86, temperature 99.6 F (37.6 C), temperature source Temporal, weight 201 lb (91.173 kg), SpO2 95.00%. VS: noted--normal. Gen: alert, NAD, NONTOXIC APPEARING. HEENT: eyes without injection, drainage, or swelling.  Ears: EACs clear, TMs with normal light reflex and landmarks.  Nose: Clear rhinorrhea, with some dried, crusty  exudate adherent to mildly injected mucosa.  No purulent d/c.  No paranasal sinus TTP.  No facial swelling.  Throat and mouth without focal lesion.  Diffuse soft palate and tonsillar/post pharyngeal erythema, with diffuse tonsillar exudate.  Tonsils not enlarged or asymmetric.  Neck: submandibular region TTP, mild LAD in these regions.  LUNGS: CTA bilat, nonlabored resps.   CV: RRR, no m/r/g. EXT: no c/c/e SKIN: no rash  LABS: Rapid strep negative today  IMPRESSION AND PLAN:  Pharyngitis, acute Send group A strep probe. Symptomatic care discussed: salt water gargle, chloraseptic spray prn, max dosing of ibuprofen OR tylenol.   An After Visit Summary was printed and given to the patient.  FOLLOW UP: prn

## 2012-03-10 LAB — CULTURE, GROUP A STREP: Organism ID, Bacteria: NORMAL

## 2012-06-01 ENCOUNTER — Encounter: Payer: Self-pay | Admitting: Family Medicine

## 2012-06-08 ENCOUNTER — Ambulatory Visit: Payer: Self-pay | Admitting: Family Medicine

## 2012-06-08 ENCOUNTER — Telehealth: Payer: Self-pay | Admitting: Family Medicine

## 2012-06-08 NOTE — Telephone Encounter (Signed)
Patient Information:  Caller Name: Ane  Phone: [phone]  Patient: [name], [name]  Gender: Female  DOB: [birth year]  Age: 52 Years  PCP: Earley Favor [facility])  Pregnant: No  Office Follow Up:  Does the office need to follow up with this patient?: No  Instructions For The Office: N/A   Symptoms  Reason For Call & Symptoms: Left shoulder and elbow pain.   Worse after shoveling snow on 06/07/12.  Relates she feels it is bursitits like she has in right shoulder.  Shoulder pain rated at 8 of 10, constant and worse at night.  Reviewed Health History In EMR: Yes  Reviewed Medications In EMR: Yes  Reviewed Allergies In EMR: Yes  Reviewed Surgeries / Procedures: Yes  Date of Onset of Symptoms: Unknown  Treatments Tried: Ice, Aspirin  Treatments Tried Worked: No OB / GYN:  LMP: Unknown  Guideline(s) Used:  Shoulder Pain  Disposition Per Guideline:   See Today or Tomorrow in Office  Reason For Disposition Reached:   Patient wants to be seen  Advice Given:  Reassurance - Shoulder Pain  Usually shoulder pain is not serious. You have told me that there is no redness, numbness, or swelling.  Here is some care advice that should help.  Pain Medicines:  For pain relief, you can take either acetaminophen, ibuprofen, or naproxen.  They are over-the-counter (OTC) pain drugs. You can buy them at the drugstore.  Call Back If  You become worse.  Appointment Scheduled:  06/08/2012 15:45:00 Appointment Scheduled Provider:  [facility])

## 2012-06-11 NOTE — Telephone Encounter (Signed)
RC on 06/11/12 (office closed on 2/13 and 2/14).  Pt arrived to office on 2/14 and no one was here.  Apologized to patient that appointment was made like that and I would let my supervisor know.  She is unable to come today.  She will follow up.  She is icing shoulder and that seems to be doing OK right now.

## 2012-08-15 ENCOUNTER — Other Ambulatory Visit: Payer: Self-pay | Admitting: Family Medicine

## 2012-08-15 NOTE — Telephone Encounter (Signed)
Rx request to pharmacy/SLS  

## 2012-10-01 ENCOUNTER — Telehealth: Payer: Self-pay | Admitting: Emergency Medicine

## 2012-10-01 MED ORDER — CITALOPRAM HYDROBROMIDE 20 MG PO TABS: 20.0000 mg | ORAL_TABLET | Freq: Every day | ORAL | Status: DC

## 2012-10-01 NOTE — Telephone Encounter (Signed)
Patient needs refill on Citalopram 20 mg can be reached at work number [phone].

## 2012-10-01 NOTE — Telephone Encounter (Signed)
Refill request for CELEXA Last filled-08.16.13, #90X1  Last seen- 11.14.13 [Acute Only], Last OV 08.13.13  Follow up - 42-months, no future appointments Please advise on refills/SLS

## 2012-10-01 NOTE — Telephone Encounter (Signed)
Rx for #30x0 to pharmacy; pt informed, *OFFICE VISIT NEEDED PRIOR TO FUTURE REFILLS*/SLS

## 2012-10-01 NOTE — Telephone Encounter (Signed)
Pt may have 1 mo supply, no RF's. Needs routine office f/u for anxiety and med management prior to any further RFs.-thx

## 2012-10-04 ENCOUNTER — Encounter: Payer: Self-pay | Admitting: Family Medicine

## 2012-10-04 ENCOUNTER — Ambulatory Visit (INDEPENDENT_AMBULATORY_CARE_PROVIDER_SITE_OTHER): Payer: BC Managed Care – PPO | Admitting: Family Medicine

## 2012-10-04 VITALS — BP 130/87 | HR 67 | Temp 98.3°F | Resp 16 | Wt 185.5 lb

## 2012-10-04 DIAGNOSIS — F411 Generalized anxiety disorder: Secondary | ICD-10-CM

## 2012-10-04 DIAGNOSIS — R1032 Left lower quadrant pain: Secondary | ICD-10-CM | POA: Insufficient documentation

## 2012-10-04 LAB — CBC WITH DIFFERENTIAL/PLATELET
Basophils Absolute: 0.1 10*3/uL (ref 0.0–0.1)
Basophils Relative: 1 % (ref 0–1)
Eosinophils Absolute: 0.2 10*3/uL (ref 0.0–0.7)
Eosinophils Relative: 2 % (ref 0–5)
HCT: 44.4 % (ref 36.0–46.0)
MCH: 29.2 pg (ref 26.0–34.0)
MCHC: 33.8 g/dL (ref 30.0–36.0)
MCV: 86.5 fL (ref 78.0–100.0)
Monocytes Absolute: 0.6 10*3/uL (ref 0.1–1.0)
Platelets: 393 10*3/uL (ref 150–400)
RDW: 13.6 % (ref 11.5–15.5)
WBC: 10.3 10*3/uL (ref 4.0–10.5)

## 2012-10-04 LAB — COMPREHENSIVE METABOLIC PANEL
Albumin: 4.8 g/dL (ref 3.5–5.2)
BUN: 11 mg/dL (ref 6–23)
CO2: 25 mEq/L (ref 19–32)
Calcium: 10 mg/dL (ref 8.4–10.5)
Chloride: 103 mEq/L (ref 96–112)
Creat: 0.55 mg/dL (ref 0.50–1.10)
Glucose, Bld: 79 mg/dL (ref 70–99)
Potassium: 4.3 mEq/L (ref 3.5–5.3)

## 2012-10-04 LAB — POCT URINALYSIS DIPSTICK
Ketones, UA: NEGATIVE
Protein, UA: NEGATIVE
Spec Grav, UA: 1.02
Urobilinogen, UA: 0.2
pH, UA: 6

## 2012-10-04 MED ORDER — CITALOPRAM HYDROBROMIDE 20 MG PO TABS: ORAL_TABLET | ORAL | Status: DC

## 2012-10-04 NOTE — Patient Instructions (Addendum)
Make appt for follow up if your abdominal pain is not better in 1 month, or if it is worsening before then.

## 2012-10-04 NOTE — Progress Notes (Signed)
OFFICE NOTE  10/28/2012  CC:  Chief Complaint  Patient presents with  . Follow-up    Anxiety & Medication Management     HPI: Patient is a 52 y.o. Caucasian female who is here for f/u GAD with some anger control problems. I last saw her 8 mo/ago.  She says she is doing well regarding her anxiety, overall reaction to stressors, and control of anger. She is compliant with her citalopram and alprazolam, no side effects.  She reports a 3 mo hx of dull, achy pain in left suprapubic region, started on left side of trunk more initially.  Seems to be worse when she has urge to urinate but no dysuria.  Worse with laying on left side.  No bulge noted, no change in pain with walking.  No injury to the area recalled.  Her OB recently did a pelvic u/s on her and it was normal per pt.  A urinalysis at [facility] office had trace blood per pt.  ROS: no fevers, no n/v/constipation.  She does have frequent postprandial runny BMs--no blood or pus noted.  No urinary urgency or frequency.  No vag d/c or bleeding. She is a current smoker.  Pertinent PMH:  Past Medical History  Diagnosis Date  . Tobacco abuse     30+ pack-yr hx  . History of pneumonia 2010    with hemoptysis; Pulm eval done, f/u CXR 04/01/09 after pneumonia was normal.  . GERD (gastroesophageal reflux disease)   . History of rheumatic fever     childhood--she reports no CV or Renal sequelae  . Venous insufficiency 01/2012  . GAD (generalized anxiety disorder)     with anger control problems   Past Surgical History  Procedure Laterality Date  . Cholecystectomy  2011  . Vaginal hysterectomy  2003    Nonmalignant reasons (DUB)  . Ovary surgery  1993    left (ovarian cyst)   Past family and social history reviewed and there are no changes since the patient's last office visit with me.  MEDS:  Outpatient Prescriptions Prior to Visit  Medication Sig Dispense Refill  . albuterol (PROAIR HFA) 108 (90 BASE) MCG/ACT inhaler Inhale 2 puffs  into the lungs every 6 (six) hours as needed for wheezing.  1 Inhaler  0  . ALPRAZolam (XANAX) 0.25 MG tablet 1-2 tabs po bid prn anxiety  60 tablet  0  . aluminum chloride (DRYSOL) 20 % external solution Apply topically as needed.      . desloratadine (CLARINEX) 5 MG tablet TAKE ONE TABLET BY MOUTH EVERY DAY  30 tablet  3  . fluticasone (CUTIVATE) 0.05 % cream Apply to affected areas bid prn  90 g  1  . furosemide (LASIX) 20 MG tablet Take 1 tablet (20 mg total) by mouth daily as needed.  10 tablet  0  . Hydrocodone-Acetaminophen (VICODIN PO) Take by mouth.      . citalopram (CELEXA) 20 MG tablet Take 1 tablet (20 mg total) by mouth daily.  30 tablet  0   No facility-administered medications prior to visit.    PE: Blood pressure 130/87, pulse 67, temperature 98.3 F (36.8 C), temperature source Oral, resp. rate 16, weight 185 lb 8 oz (84.142 kg), SpO2 99.00%. Gen: Alert, well appearing, overweight-appearing.  Patient is oriented to person, place, time, and situation. AFFECT: pleasant, lucid thought and speech. ENT: no iscterus. Neck - No masses or thyromegaly or limitation in range of motion CV: RRR, no m/r/g.   LUNGS: CTA  bilat, nonlabored resps, good aeration in all lung fields. ABD: soft, ND, BS slightly decreased.  She has diffuse mild lower abdominal tenderness without guarding or rebound.  No hepatospenomegaly or mass.  No bruits. EXT: no clubbing, cyanosis, or edema.   LAB: CC UA today was NORMAL.  IMPRESSION AND PLAN:  Abdominal pain, left lower quadrant Nonspecific, no significant red flag feautures. Repeat UA today was normal (trace blood at GYN office per pt). Will check CBC w/diff, CMET, and ESR. Reassured pt.  Watchful waiting approach discussed.  At f/u visits I'll recheck urinalysis and if blood detected again then I'll ask urology to see her for consideration of urine cytology/cystoscopy. Signs/symptoms to call or return for were reviewed and pt expressed  understanding.   GAD (generalized anxiety disorder) W/ anger control problems. Stable--continue current meds.   An After Visit Summary was printed and given to the patient.   FOLLOW UP: 6 mo

## 2012-10-28 ENCOUNTER — Encounter: Payer: Self-pay | Admitting: Family Medicine

## 2012-10-28 NOTE — Assessment & Plan Note (Signed)
W/ anger control problems. Stable--continue current meds.

## 2012-10-28 NOTE — Assessment & Plan Note (Addendum)
Nonspecific, no significant red flag feautures. Repeat UA today was normal (trace blood at GYN office per pt). Will check CBC w/diff, CMET, and ESR. Reassured pt.  Watchful waiting approach discussed.  At f/u visits I'll recheck urinalysis and if blood detected again then I'll ask urology to see her for consideration of urine cytology/cystoscopy. Signs/symptoms to call or return for were reviewed and pt expressed understanding.

## 2013-08-02 ENCOUNTER — Ambulatory Visit (INDEPENDENT_AMBULATORY_CARE_PROVIDER_SITE_OTHER): Payer: BC Managed Care – PPO | Admitting: Family Medicine

## 2013-08-02 ENCOUNTER — Encounter: Payer: Self-pay | Admitting: Family Medicine

## 2013-08-02 VITALS — BP 129/86 | HR 79 | Temp 97.8°F | Resp 18 | Ht 61.5 in | Wt 184.0 lb

## 2013-08-02 DIAGNOSIS — M25519 Pain in unspecified shoulder: Secondary | ICD-10-CM

## 2013-08-02 DIAGNOSIS — M7550 Bursitis of unspecified shoulder: Secondary | ICD-10-CM

## 2013-08-02 DIAGNOSIS — M75100 Unspecified rotator cuff tear or rupture of unspecified shoulder, not specified as traumatic: Secondary | ICD-10-CM

## 2013-08-02 DIAGNOSIS — M25511 Pain in right shoulder: Secondary | ICD-10-CM | POA: Insufficient documentation

## 2013-08-02 DIAGNOSIS — IMO0002 Reserved for concepts with insufficient information to code with codable children: Secondary | ICD-10-CM

## 2013-08-02 MED ORDER — HYDROCODONE-ACETAMINOPHEN 5-325 MG PO TABS: ORAL_TABLET | ORAL | Status: DC

## 2013-08-02 MED ORDER — METHYLPREDNISOLONE ACETATE 40 MG/ML IJ SUSP
40.0000 mg | Freq: Once | INTRAMUSCULAR | Status: AC
  Administered 2013-08-02: 40 mg via INTRA_ARTICULAR

## 2013-08-02 MED ORDER — DESLORATADINE 5 MG PO TABS: ORAL_TABLET | ORAL | Status: DC

## 2013-08-02 MED ORDER — CITALOPRAM HYDROBROMIDE 20 MG PO TABS: ORAL_TABLET | ORAL | Status: DC

## 2013-08-02 NOTE — Assessment & Plan Note (Signed)
Suspect subacromial bursitis. Also has some tenderness in right AC joint but is so reactive to palpation in right shoulder due to her acute pain that it is hard to tell if the [facility] joint tenderness is true.   Pt desired injection of steroid today. Written consent obtained.   Procedure: Therapeutic shoulder injection.  The patient's clinical condition is marked by substantial pain and/or significant functional disability.  Other conservative therapy has not provided relief, is contraindicated, or not appropriate.  There is a reasonable likelihood that injection will significantly improve the patient's pain and/or functional disability. Cleaned skin with alcohol swab, used posterolateral approach, Injected 1 ml of 40mg /ml depo medrol + 2 ml of 1% lidocaine w/out epi into subacromial space without resistance.  No immediate complications.  Patient tolerated procedure well.  Post-injection care discussed, including 20 min of icing 1-2 times in the next 4-8 hours, frequent non weight-bearing ROM exercises over the next few days, and general pain medication management.  Vicodin 5/325, 1-2 tabs po q6h prn, #30, no RF.  If not improving in 1 wk then she is to call or return--will likely obtain right shoulder MRI at that point vs ortho referral.

## 2013-08-02 NOTE — Progress Notes (Signed)
OFFICE NOTE  08/02/2013  CC:  Chief Complaint  Patient presents with  . Shoulder Pain    right   . Medication Refill    clarinex, celexa    HPI: Patient is a 53 y.o. Caucasian female who is here for right shoulder pain. Onset about 2 wks ago, without any preceding injury, intermittent at first, then became constant, hurts/bothers the most at night when trying to sleep--"like a tooth ache". Raising arm up/out makes it hurt.  Ice helps it.  Tylenol, ibuprofen prn no help. Has had "bursitis" problems in both shoulders at one point or another and her most recent injection was about 2 yrs ago.  Pertinent PMH:  Past medical, surgical, social, and family history reviewed and no changes are noted since last office visit.  MEDS:  Celexa 20mg  qd, clarinex 5mg  qd, nicotine patch  PE: Blood pressure 129/86, pulse 79, temperature 97.8 F (36.6 C), temperature source Oral, resp. rate 18, height 5' 1.5" (1.562 m), weight 184 lb (83.462 kg), SpO2 98.00%. Gen: Alert, well appearing.  Patient is oriented to person, place, time, and situation. Right shoulder with significant tenderness around/under acromion.  Mild TTP over right AC joint.  Abduction in right arm limited to 90 deg, flex 90 deg, ext about 30 deg.  + O'brien's test. Neck nontender.  Mild TTP in right upper trapezius region.  Spurling's negative.   Right arm with mild proximal weakness secondary to pain, no distal weakness.  No sensory deficit in arm.   IMPRESSION AND PLAN:  Right shoulder pain Suspect subacromial bursitis. Also has some tenderness in right AC joint but is so reactive to palpation in right shoulder due to her acute pain that it is hard to tell if the [facility] joint tenderness is true.   Pt desired injection of steroid today. Written consent obtained.   Procedure: Therapeutic shoulder injection.  The patient's clinical condition is marked by substantial pain and/or significant functional disability.  Other conservative  therapy has not provided relief, is contraindicated, or not appropriate.  There is a reasonable likelihood that injection will significantly improve the patient's pain and/or functional disability. Cleaned skin with alcohol swab, used posterolateral approach, Injected 1 ml of 40mg /ml depo medrol + 2 ml of 1% lidocaine w/out epi into subacromial space without resistance.  No immediate complications.  Patient tolerated procedure well.  Post-injection care discussed, including 20 min of icing 1-2 times in the next 4-8 hours, frequent non weight-bearing ROM exercises over the next few days, and general pain medication management.  Vicodin 5/325, 1-2 tabs po q6h prn, #30, no RF.  If not improving in 1 wk then she is to call or return--will likely obtain right shoulder MRI at that point vs ortho referral.    Her GAD/anger control problem is stable on citalopram 20mg  qd and I refilled this today.  Allergic rhinitis: refilled clarinex 5mg  qd. An After Visit Summary was printed and given to the patient.  FOLLOW UP:  As needed

## 2013-08-05 ENCOUNTER — Telehealth: Payer: Self-pay | Admitting: Family Medicine

## 2013-08-05 NOTE — Telephone Encounter (Signed)
Relevant patient education assigned to patient using Emmi. ° °

## 2013-08-14 ENCOUNTER — Telehealth: Payer: Self-pay | Admitting: Family Medicine

## 2013-08-14 DIAGNOSIS — M25511 Pain in right shoulder: Secondary | ICD-10-CM

## 2013-08-14 MED ORDER — HYDROCODONE-ACETAMINOPHEN 5-325 MG PO TABS: ORAL_TABLET | ORAL | Status: DC

## 2013-08-14 NOTE — Telephone Encounter (Signed)
Pt LMOM for you to go ahead and do orthopedist referral and she has no preference.  Patient will be in today to pick up Rx.

## 2013-08-14 NOTE — Telephone Encounter (Signed)
Patient LMOM on VM and said she was still having pain in the shoulder that you injected on 08/02/13.  She was requesting refill of hydrocodone.  Last Rx was # 30 on 08/02/13.  Please advise.

## 2013-08-14 NOTE — Telephone Encounter (Signed)
[place] ortho referral ordered.

## 2013-08-14 NOTE — Telephone Encounter (Signed)
LMOM for pt to CB.  

## 2013-08-14 NOTE — Telephone Encounter (Signed)
Rx printed. I need her to see an orthopedist. Ask if referral in [place] is ok or does she prefer different location?

## 2013-08-29 ENCOUNTER — Telehealth: Payer: Self-pay

## 2013-08-29 NOTE — Telephone Encounter (Signed)
Pt called today and stated that [facility] in Muc[facility]x with her Hydrocodone. She has an appoint ment with the orthopedist on 09/09/2013 at 1:45 pm. She only has two pills left and would like a refill on her Hydrocodone until she can get in to see the orthopedist. Please advise.

## 2013-08-30 NOTE — Telephone Encounter (Signed)
Dr Milinda CaveMcGowen verbally stated that he cannot refill her Hydrocodone. She would have to take this up with the pharmacy. Called pt, her mailbox is full.

## 2013-09-12 ENCOUNTER — Encounter: Payer: Self-pay | Admitting: Family Medicine

## 2014-01-09 ENCOUNTER — Telehealth: Payer: Self-pay | Admitting: Family Medicine

## 2014-01-09 MED ORDER — ALPRAZOLAM 0.25 MG PO TABS: ORAL_TABLET | ORAL | Status: DC

## 2014-01-09 NOTE — Telephone Encounter (Signed)
Xanax rx printed. 

## 2014-01-09 NOTE — Telephone Encounter (Signed)
Patient requesting rf of xanax.   Last OV was 08/07/13.  Last RX was 11/14/13 # 60 x no rf.  Please advise.

## 2014-01-09 NOTE — Telephone Encounter (Signed)
Phoned in RX.  Left detailed message for pt to p/u RX.

## 2014-11-13 ENCOUNTER — Other Ambulatory Visit: Payer: Self-pay | Admitting: Family Medicine

## 2014-11-13 MED ORDER — DESLORATADINE 5 MG PO TABS: ORAL_TABLET | ORAL | Status: DC

## 2014-11-13 MED ORDER — CITALOPRAM HYDROBROMIDE 20 MG PO TABS: ORAL_TABLET | ORAL | Status: DC

## 2014-11-13 NOTE — Telephone Encounter (Signed)
Pt is asking for a refill on her Citalopram  and her Desloratadine . She is scheduled for an appt. 11/21/2014.

## 2014-11-13 NOTE — Telephone Encounter (Signed)
RF request for citalopram Last written: 08/02/13 #90 w/ 2RF  RF request for desloratodine Last written: 08/02/13 #30 w/ 6RF  LOV: 08/03/14 NOV: 11/21/14  Please advise. Thanks.

## 2014-11-21 ENCOUNTER — Ambulatory Visit (INDEPENDENT_AMBULATORY_CARE_PROVIDER_SITE_OTHER): Payer: BLUE CROSS/BLUE SHIELD | Admitting: Family Medicine

## 2014-11-21 ENCOUNTER — Encounter: Payer: Self-pay | Admitting: Family Medicine

## 2014-11-21 VITALS — BP 146/85 | HR 61 | Temp 98.1°F | Resp 16 | Ht 60.5 in | Wt 196.0 lb

## 2014-11-21 DIAGNOSIS — R52 Pain, unspecified: Secondary | ICD-10-CM

## 2014-11-21 DIAGNOSIS — Z0000 Encounter for general adult medical examination without abnormal findings: Secondary | ICD-10-CM

## 2014-11-21 MED ORDER — CITALOPRAM HYDROBROMIDE 20 MG PO TABS: ORAL_TABLET | ORAL | Status: DC

## 2014-11-21 NOTE — Progress Notes (Signed)
Office Note 11/22/2014  CC:  Chief Complaint  Patient presents with  . Annual Exam    Pt is not fasting.     HPI:  [name] is a 54 y.o. White female who is here for annual health maintenance exam. She is a current everyday smoker.  Currently contemplating retry of quitting with assistance of nicotine patches. Gets mammogram and cerv ca screening via Dr. Huntley Dec in [facility].  Acute complaint: L side pain constant x 3 wks.  Intermittent for a year or so before this.  Character of pain: sharp.  Vicodin dulls it.  No radiation. She points above the iliac crest.  Feels nauseated when pain starts to get bad.  Never vomits.  No change in stool pattern.  Has 1-2 BM's per day. Ice/heat does not change it.  She notes nothing that makes it worse. No urinary urgency or frequency, no dysuria, no hematuria.     Past Medical History  Diagnosis Date  . Tobacco abuse     30+ pack-yr hx  . History of pneumonia 2010    with hemoptysis; Pulm eval done, f/u CXR 04/01/09 after pneumonia was normal.  . GERD (gastroesophageal reflux disease)   . History of rheumatic fever     childhood--she reports no CV or Renal sequelae  . Venous insufficiency 01/2012  . GAD (generalized anxiety disorder)     with anger control problems  . IBS (irritable bowel syndrome)     Diarrhea predominant  . Chronic neck pain     plan for MRI neck by Dr. Zachery Dauer as of 09/09/13  . Impingement syndrome of right shoulder     Past Surgical History  Procedure Laterality Date  . Cholecystectomy  2011  . Vaginal hysterectomy  2003    Nonmalignant reasons (DUB)  . Ovary surgery  1993    left (ovarian cyst)  . Colonoscopy  approx 2007    Dr. Jarold Motto (normal per pt--cannot locate path or procedure record)  . Esophagogastroduodenoscopy  "   "    Mild esophagogastric junction inflammation, otherwise normal.    Family History  Problem Relation Age of Onset  . Alcohol abuse Father   . Heart disease Maternal  Grandfather     History   Social History  . Marital Status: Married    Spouse Name: N/A  . Number of Children: N/A  . Years of Education: N/A   Occupational History  . Not on file.   Social History Main Topics  . Smoking status: Current Every Day Smoker    Types: Cigarettes  . Smokeless tobacco: Never Used  . Alcohol Use: Yes     Comment: occ wine  . Drug Use: No  . Sexual Activity: Not on file   Other Topics Concern  . Not on file   Social History Narrative   Divorced, one 42 y/o son.  Has 3 brothers and 3 sisters--healthy per pt report.   Orig from [place], [place] and relocated to [facility] [place].   Works in Airline pilot for [employer].   Tobacco: 30 + yrs, 1 ppd.   Rare glass of wine.   No drug use, no exercise.   Has a shitzu, parrot, and a parakeet.  Lives alone in [place].    Outpatient Prescriptions Prior to Visit  Medication Sig Dispense Refill  . aluminum chloride (DRYSOL) 20 % external solution Apply topically as needed.    . desloratadine (CLARINEX) 5 MG tablet TAKE ONE TABLET BY MOUTH EVERY DAY 30  tablet 6  . HYDROcodone-acetaminophen (NORCO/VICODIN) 5-325 MG per tablet 1-2 tabs po q6h prn pain 30 tablet 0  . citalopram (CELEXA) 20 MG tablet 1 tab po qd 90 tablet 3  . albuterol (PROAIR HFA) 108 (90 BASE) MCG/ACT inhaler Inhale 2 puffs into the lungs every 6 (six) hours as needed for wheezing. 1 Inhaler 0  . ALPRAZolam (XANAX) 0.25 MG tablet 1-2 tabs po bid prn anxiety (Patient not taking: Reported on 11/21/2014) 60 tablet 3  . fluticasone (CUTIVATE) 0.05 % cream Apply to affected areas bid prn (Patient not taking: Reported on 11/21/2014) 90 g 1  . furosemide (LASIX) 20 MG tablet Take 1 tablet (20 mg total) by mouth daily as needed. 10 tablet 0   No facility-administered medications prior to visit.    No Known Allergies  ROS Review of Systems  Constitutional: Negative for fever, chills, appetite change and fatigue.  HENT: Negative for  congestion, dental problem, ear pain and sore throat.   Eyes: Negative for discharge, redness and visual disturbance.  Respiratory: Negative for cough, chest tightness, shortness of breath and wheezing.   Cardiovascular: Negative for chest pain, palpitations and leg swelling.  Gastrointestinal: Negative for nausea, vomiting, abdominal pain, diarrhea and blood in stool.  Genitourinary: Negative for dysuria, urgency, frequency, hematuria, flank pain and difficulty urinating.  Musculoskeletal: Negative for myalgias, back pain, joint swelling, arthralgias and neck stiffness.  Skin: Negative for pallor and rash.  Neurological: Negative for dizziness, speech difficulty, weakness and headaches.  Hematological: Negative for adenopathy. Does not bruise/bleed easily.  Psychiatric/Behavioral: Negative for confusion and sleep disturbance. The patient is not nervous/anxious.     PE; Blood pressure 146/85, pulse 61, temperature 98.1 F (36.7 C), temperature source Oral, resp. rate 16, height 5' 0.5" (1.537 m), weight 196 lb (88.905 kg), SpO2 95 %. Gen: Alert, well appearing.  Patient is oriented to person, place, time, and situation. AFFECT: pleasant, lucid thought and speech. ENT: Ears: EACs clear, normal epithelium.  TMs with good light reflex and landmarks bilaterally.  Eyes: no injection, icteris, swelling, or exudate.  EOMI, PERRLA. Nose: no drainage or turbinate edema/swelling.  No injection or focal lesion.  Mouth: lips without lesion/swelling.  Oral mucosa pink and moist.  Dentition intact and without obvious caries or gingival swelling.  Oropharynx without erythema, exudate, or swelling.  Neck: supple/nontender.  No LAD, mass, or TM.  Carotid pulses 2+ bilaterally, without bruits. CV: RRR, no m/r/g.   LUNGS: CTA bilat, nonlabored resps, good aeration in all lung fields. ABD: soft, rotund/obese, NT, ND, BS normal.  No hepatospenomegaly or mass.  No bruits. Left side with mild discomfort to  palpation under ribs and above iliac crest.  No CVA TTP.   EXT: no clubbing, cyanosis, or edema.  Musculoskeletal: no joint swelling, erythema, warmth, or tenderness.  ROM of all joints intact. Skin - no sores or suspicious lesions or rashes or color changes   Pertinent labs:  No results found for: TSH Lab Results  Component Value Date   WBC 10.3 10/04/2012   HGB 15.0 10/04/2012   HCT 44.4 10/04/2012   MCV 86.5 10/04/2012   PLT 393 10/04/2012   Lab Results  Component Value Date   CREATININE 0.55 10/04/2012   BUN 11 10/04/2012   NA 138 10/04/2012   K 4.3 10/04/2012   CL 103 10/04/2012   CO2 25 10/04/2012   Lab Results  Component Value Date   ALT 36* 10/04/2012   AST 25 10/04/2012   ALKPHOS 83  10/04/2012   BILITOT 0.3 10/04/2012     ASSESSMENT AND PLAN:   1) L side pain; likely muscular.  Will check urinalysis with reflex microscopy and also renal u/s. No meds rx'd today but pt admits she is using vicodin for this at times (her pain mgmt md rx's this for her back).  2) Health maintenance exam: Reviewed age and gender appropriate health maintenance issues (prudent diet, regular exercise, health risks of tobacco and excessive alcohol, use of seatbelts, fire alarms in home, use of sunscreen).  Also reviewed age and gender appropriate health screening as well as vaccine recommendations. She declined HIV screening today.  She'll get fasting health panel labs via her employer health clinic soon.   Next colonoscopy due 2017--we'll arrange at the time of her next annual health maintenance exam. Pap and mammogram to be done via her GYN soon--pt to arrange.   FOLLOW UP:  Return in about 6 months (around 05/24/2015) for routine chronic illness f/u.

## 2014-11-21 NOTE — Progress Notes (Signed)
Pre visit review using our clinic review tool, if applicable. No additional management support is needed unless otherwise documented below in the visit note. 

## 2014-11-22 LAB — URINALYSIS, ROUTINE W REFLEX MICROSCOPIC
Bilirubin Urine: NEGATIVE
Glucose, UA: NEGATIVE
HGB URINE DIPSTICK: NEGATIVE
KETONES UR: NEGATIVE
LEUKOCYTES UA: NEGATIVE
NITRITE: NEGATIVE
PROTEIN: NEGATIVE
Specific Gravity, Urine: 1.013 (ref 1.001–1.035)
pH: 6 (ref 5.0–8.0)

## 2014-11-25 ENCOUNTER — Ambulatory Visit (HOSPITAL_BASED_OUTPATIENT_CLINIC_OR_DEPARTMENT_OTHER)
Admission: RE | Admit: 2014-11-25 | Discharge: 2014-11-25 | Disposition: A | Discharge status: Home / Self Care | Payer: BLUE CROSS/BLUE SHIELD | Source: Ambulatory Visit | Attending: Family Medicine | Admitting: Family Medicine

## 2014-11-25 DIAGNOSIS — M545 Low back pain: Secondary | ICD-10-CM | POA: Diagnosis not present

## 2015-03-11 ENCOUNTER — Telehealth: Payer: Self-pay | Admitting: *Deleted

## 2015-03-11 NOTE — Telephone Encounter (Signed)
Pt LMOM on 03/11/15 at 11:13am requesting something be called in for her cough. She stated that she has had a cough x 1 week. She is requesting a inhaler or cough syrup. She stated that she does not have the money for a co-pay. Please advise. Thanks.

## 2015-03-11 NOTE — Telephone Encounter (Signed)
Pt advised and voiced understanding.   

## 2015-03-11 NOTE — Telephone Encounter (Signed)
Sorry, no can do. Recommend robitussin DM or mucinex DM. No rx cough med unless seen in office to determine if appropriate.  It's just the rules.-thx

## 2015-04-14 ENCOUNTER — Other Ambulatory Visit: Payer: Self-pay | Admitting: *Deleted

## 2015-04-14 MED ORDER — ALPRAZOLAM 0.25 MG PO TABS: ORAL_TABLET | ORAL | Status: DC

## 2015-04-14 NOTE — Telephone Encounter (Signed)
Pt called requesting a refill.  RF request for alprazolam LOV: 11/21/14 Next ov: None Last written: 01/09/14 #60 w/ 3RF  Please advise. Thanks.

## 2015-04-15 NOTE — Telephone Encounter (Signed)
Rx faxed

## 2015-04-26 DIAGNOSIS — M2550 Pain in unspecified joint: Secondary | ICD-10-CM

## 2015-04-26 HISTORY — DX: Pain in unspecified joint: M25.50

## 2015-09-11 DIAGNOSIS — M542 Cervicalgia: Secondary | ICD-10-CM | POA: Diagnosis not present

## 2015-09-11 DIAGNOSIS — M50322 Other cervical disc degeneration at C5-C6 level: Secondary | ICD-10-CM | POA: Diagnosis not present

## 2015-09-11 DIAGNOSIS — Z79891 Long term (current) use of opiate analgesic: Secondary | ICD-10-CM | POA: Diagnosis not present

## 2015-09-11 DIAGNOSIS — G894 Chronic pain syndrome: Secondary | ICD-10-CM | POA: Diagnosis not present

## 2015-11-03 DIAGNOSIS — M5030 Other cervical disc degeneration, unspecified cervical region: Secondary | ICD-10-CM | POA: Diagnosis not present

## 2015-11-03 DIAGNOSIS — M2550 Pain in unspecified joint: Secondary | ICD-10-CM | POA: Diagnosis not present

## 2015-11-03 DIAGNOSIS — G894 Chronic pain syndrome: Secondary | ICD-10-CM | POA: Diagnosis not present

## 2015-11-03 DIAGNOSIS — Z840 Family history of diseases of the skin and subcutaneous tissue: Secondary | ICD-10-CM | POA: Diagnosis not present

## 2015-11-05 ENCOUNTER — Encounter: Payer: Self-pay | Admitting: Family Medicine

## 2015-11-19 ENCOUNTER — Other Ambulatory Visit: Payer: Self-pay | Admitting: Family Medicine

## 2015-11-20 NOTE — Telephone Encounter (Signed)
RF request for desloratadine LOV: 11/21/14  Next ov: None Last written: 11/13/14 #30 w/ 0RF  Rx sent for #30 w/ 2RF.  RF request for citalopram Last written: 11/21/14 #90 w/ 3RF  Rx sent for #90 w/ 0RF. Pt needs office visit for more refills.

## 2015-11-23 NOTE — Telephone Encounter (Signed)
Left message for pt to call back  °

## 2015-12-01 NOTE — Telephone Encounter (Signed)
Left message on cell vm for pt to call back.  

## 2015-12-14 MED ORDER — ALPRAZOLAM 0.25 MG PO TABS: ORAL_TABLET | ORAL | 5 refills | Status: DC

## 2015-12-14 NOTE — Telephone Encounter (Signed)
Pt advised and voiced understanding.  Apt made for 01/01/16.   Pt also wanted to see if Dr. Milinda CaveMcGowen would refill her alprazolam.   RF request for alprazolam LOV: 11/21/14 Next ov: scheduled today 01/01/16 Last written: 04/14/15 #60 w/ 5RF  Please advise. Thanks.

## 2015-12-14 NOTE — Telephone Encounter (Signed)
Rx faxed to pharmacy. Left detailed message on cell vm, okay per DPR.

## 2015-12-16 DIAGNOSIS — Z79891 Long term (current) use of opiate analgesic: Secondary | ICD-10-CM | POA: Diagnosis not present

## 2015-12-16 DIAGNOSIS — M50322 Other cervical disc degeneration at C5-C6 level: Secondary | ICD-10-CM | POA: Diagnosis not present

## 2015-12-16 DIAGNOSIS — G894 Chronic pain syndrome: Secondary | ICD-10-CM | POA: Diagnosis not present

## 2015-12-25 DIAGNOSIS — E785 Hyperlipidemia, unspecified: Secondary | ICD-10-CM

## 2015-12-25 HISTORY — DX: Hyperlipidemia, unspecified: E78.5

## 2016-01-01 ENCOUNTER — Encounter: Payer: Self-pay | Admitting: Family Medicine

## 2016-01-01 ENCOUNTER — Ambulatory Visit (INDEPENDENT_AMBULATORY_CARE_PROVIDER_SITE_OTHER): Payer: BLUE CROSS/BLUE SHIELD | Admitting: Family Medicine

## 2016-01-01 VITALS — BP 125/71 | HR 83 | Temp 96.8°F | Resp 16 | Ht 61.0 in | Wt 197.0 lb

## 2016-01-01 DIAGNOSIS — E785 Hyperlipidemia, unspecified: Secondary | ICD-10-CM

## 2016-01-01 DIAGNOSIS — Z0000 Encounter for general adult medical examination without abnormal findings: Secondary | ICD-10-CM | POA: Diagnosis not present

## 2016-01-01 DIAGNOSIS — Z1211 Encounter for screening for malignant neoplasm of colon: Secondary | ICD-10-CM

## 2016-01-01 LAB — CBC WITH DIFFERENTIAL/PLATELET
BASOS ABS: 0.1 10*3/uL (ref 0.0–0.1)
BASOS PCT: 0.6 % (ref 0.0–3.0)
Eosinophils Absolute: 0.4 10*3/uL (ref 0.0–0.7)
Eosinophils Relative: 3.8 % (ref 0.0–5.0)
HCT: 43.6 % (ref 36.0–46.0)
Hemoglobin: 14.8 g/dL (ref 12.0–15.0)
LYMPHS ABS: 4 10*3/uL (ref 0.7–4.0)
Lymphocytes Relative: 38.3 % (ref 12.0–46.0)
MCHC: 33.9 g/dL (ref 30.0–36.0)
MCV: 89.4 fl (ref 78.0–100.0)
Monocytes Absolute: 0.6 10*3/uL (ref 0.1–1.0)
Monocytes Relative: 5.7 % (ref 3.0–12.0)
NEUTROS ABS: 5.4 10*3/uL (ref 1.4–7.7)
NEUTROS PCT: 51.6 % (ref 43.0–77.0)
PLATELETS: 331 10*3/uL (ref 150.0–400.0)
RBC: 4.88 Mil/uL (ref 3.87–5.11)
RDW: 13 % (ref 11.5–15.5)
WBC: 10.5 10*3/uL (ref 4.0–10.5)

## 2016-01-01 LAB — COMPREHENSIVE METABOLIC PANEL
ALT: 28 U/L (ref 0–35)
AST: 20 U/L (ref 0–37)
Albumin: 4.2 g/dL (ref 3.5–5.2)
Alkaline Phosphatase: 76 U/L (ref 39–117)
BUN: 18 mg/dL (ref 6–23)
CHLORIDE: 101 meq/L (ref 96–112)
CO2: 33 meq/L — AB (ref 19–32)
CREATININE: 0.65 mg/dL (ref 0.40–1.20)
Calcium: 10.3 mg/dL (ref 8.4–10.5)
GFR: 100.66 mL/min (ref >60.00)
Glucose, Bld: 184 mg/dL — ABNORMAL HIGH (ref 70–99)
Potassium: 5.2 mEq/L — ABNORMAL HIGH (ref 3.5–5.1)
SODIUM: 141 meq/L (ref 135–145)
Total Bilirubin: 0.2 mg/dL (ref 0.2–1.2)
Total Protein: 7.4 g/dL (ref 6.0–8.3)

## 2016-01-01 LAB — TSH: TSH: 1.9 u[IU]/mL (ref 0.35–4.50)

## 2016-01-01 MED ORDER — ATORVASTATIN CALCIUM 20 MG PO TABS: 20.0000 mg | ORAL_TABLET | Freq: Every day | ORAL | 3 refills | Status: DC

## 2016-01-01 MED ORDER — CITALOPRAM HYDROBROMIDE 20 MG PO TABS: 20.0000 mg | ORAL_TABLET | Freq: Every day | ORAL | 3 refills | Status: DC

## 2016-01-01 NOTE — Progress Notes (Signed)
Office Note 01/01/2016  CC:  Chief Complaint  Patient presents with  . Annual Exam    HPI:  [name] is a 55 y.o. White female who is here for annual health maintenance exam. Says she has quit smoking for the most part.  The exam room does smell like cigarettes, though. No acute complaints.  She describes her fibromyalgia pain as "I constantly feel like I have the flu". Still seeing Dr. Ethelene Halamos for pain mgmt.    Past Medical History:  Diagnosis Date  . Chronic neck pain    plan for MRI neck by Dr. Zachery DauerBarnes as of 09/09/13  . GAD (generalized anxiety disorder)    with anger control problems  . GERD (gastroesophageal reflux disease)   . History of pneumonia 2010   with hemoptysis; Pulm eval done, f/u CXR 04/01/09 after pneumonia was normal.  . History of rheumatic fever    childhood--she reports no CV or Renal sequelae  . IBS (irritable bowel syndrome)    Diarrhea predominant  . Impingement syndrome of right shoulder   . Polyarthralgia 2017   Rheum eval (Dr. Nickola MajorHawkes) 10/2015: no inflammatory arthritis suspected.  . Tobacco abuse    30+ pack-yr hx  . Venous insufficiency 01/2012    Past Surgical History:  Procedure Laterality Date  . CHOLECYSTECTOMY  2011  . COLONOSCOPY  approx 2007   Dr. Jarold MottoPatterson (normal per pt--cannot locate path or procedure record)  . ESOPHAGOGASTRODUODENOSCOPY  "   "   Mild esophagogastric junction inflammation, otherwise normal.  . OVARY SURGERY  1993   left (ovarian cyst)  . VAGINAL HYSTERECTOMY  2003   Nonmalignant reasons (DUB)    Family History  Problem Relation Age of Onset  . Alcohol abuse Father   . Heart disease Maternal Grandfather     Social History   Social History  . Marital status: Married    Spouse name: N/A  . Number of children: N/A  . Years of education: N/A   Occupational History  . Not on file.   Social History Main Topics  . Smoking status: Light Tobacco Smoker    Types: Cigarettes  . Smokeless  tobacco: Never Used  . Alcohol use Yes     Comment: occ wine  . Drug use: No  . Sexual activity: Not Currently   Other Topics Concern  . Not on file   Social History Narrative   Divorced, one 55 y/o son.  Has 3 brothers and 3 sisters--healthy per pt report.   [place] and [place]   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   Tobacco: 30 + yrs, 1 ppd.   Rare glass of wine.   No drug use, no exercise.   Has a shitzu, parrot, and a parakeet.  [place]ives [place].    Outpatient Medications Prior to Visit  Medication Sig Dispense Refill  . albuterol (PROAIR HFA) 108 (90 BASE) MCG/ACT inhaler Inhale 2 puffs into the lungs every 6 (six) hours as needed for wheezing. 1 Inhaler 0  . ALPRAZolam (XANAX) 0.25 MG tablet 1-2 tabs po bid prn anxiety 60 tablet 5  . aluminum chloride (DRYSOL) 20 % external solution Apply topically as needed.    . desloratadine (CLARINEX) 5 MG tablet TAKE ONE TABLET BY MOUTH ONCE DAILY 30 tablet 2  . fluticasone (CUTIVATE) 0.05 % cream Apply to affected areas bid prn (Patient not taking: Reported on 11/21/2014) 90 g 1  . furosemide (LASIX) 20 MG  tablet Take 1 tablet (20 mg total) by mouth daily as needed. 10 tablet 0  . HYDROcodone-acetaminophen (NORCO/VICODIN) 5-325 MG per tablet 1-2 tabs po q6h prn pain 30 tablet 0  . citalopram (CELEXA) 20 MG tablet 1 tab po qd 90 tablet 3  . citalopram (CELEXA) 20 MG tablet TAKE ONE TABLET BY MOUTH ONCE DAILY 90 tablet 0   No facility-administered medications prior to visit.     No Known Allergies  ROS Review of Systems  Constitutional: Negative for appetite change, chills, fatigue and fever.  HENT: Negative for congestion, dental problem, ear pain and sore throat.   Eyes: Negative for discharge, redness and visual disturbance.  Respiratory: Negative for cough, chest tightness, shortness of breath and wheezing.   Cardiovascular: Negative for chest pain, palpitations and leg  swelling.  Gastrointestinal: Negative for abdominal pain, blood in stool, diarrhea, nausea and vomiting.  Genitourinary: Negative for difficulty urinating, dysuria, flank pain, frequency, hematuria and urgency.  Musculoskeletal: Positive for myalgias (chronic (fibromyalgia)). Negative for arthralgias, back pain, joint swelling and neck stiffness.  Skin: Negative for pallor and rash.  Neurological: Negative for dizziness, speech difficulty, weakness and headaches.  Hematological: Negative for adenopathy. Does not bruise/bleed easily.  Psychiatric/Behavioral: Negative for confusion and sleep disturbance. The patient is not nervous/anxious.     PE; Blood pressure 125/71, pulse 83, temperature (!) 96.8 F (36 C), temperature source Temporal, resp. rate 16, height 5\' 1"  (1.549 m), weight 197 lb (89.4 kg), SpO2 97 %. Body mass index is 37.22 kg/m.  Gen: Alert, well appearing, obese WF in NAD.  Patient is oriented to person, place, time, and situation. AFFECT: pleasant, lucid thought and speech. ENT: Ears: EACs clear, normal epithelium.  TMs with good light reflex and landmarks bilaterally.  Eyes: no injection, icteris, swelling, or exudate.  EOMI, PERRLA. Nose: no drainage or turbinate edema/swelling.  No injection or focal lesion.  Mouth: lips without lesion/swelling.  Oral mucosa pink and moist.  Dentition intact and without obvious caries or gingival swelling.  Oropharynx without erythema, exudate, or swelling.  Neck: supple/nontender.  No LAD, mass, or TM.  Carotid pulses 2+ bilaterally, without bruits. CV: RRR, no m/r/g.   LUNGS: CTA bilat, nonlabored resps, good aeration in all lung fields. ABD: soft, NT, ND, BS normal.  No hepatospenomegaly or mass.  No bruits. EXT: no clubbing, cyanosis, or edema.  Musculoskeletal: no joint swelling, erythema, warmth, or tenderness.  ROM of all joints intact. Skin - no sores or suspicious lesions or rashes or color changes   Pertinent labs:  Lipid  panel brought in from her employer, dated 11/17/15: Tchol 242, HDL 46, LDL124, Trigs 360.  Fasting glucose was 94.  ASSESSMENT AND PLAN:   1) Health maintenance exam: Reviewed age and gender appropriate health maintenance issues (prudent diet, regular exercise, health risks of tobacco and excessive alcohol, use of seatbelts, fire alarms in home, use of sunscreen).  Also reviewed age and gender appropriate health screening as well as vaccine recommendations. She says she will make appt with her GYN MD for an exam and get mammogram through him. She defers colon cancer screening at this time for financial reasons: she is due for repeat colonoscopy this year. Check CBC, TSH, and CMET today (non-fasting).  2) Hyperlipidemia: start atorvastatin 20mg  qd. Therapeutic expectations and side effect profile of medication discussed today.  Patient's questions answered.  An After Visit Summary was printed and given to the patient.  FOLLOW UP:  Return in about 3 months (around  04/01/2016) for f/u hyperlipidemia (fasting).  Signed:  Santiago Bumpers, MD           01/01/2016

## 2016-01-04 ENCOUNTER — Encounter: Payer: Self-pay | Admitting: Gastroenterology

## 2016-01-04 ENCOUNTER — Other Ambulatory Visit (INDEPENDENT_AMBULATORY_CARE_PROVIDER_SITE_OTHER): Payer: BLUE CROSS/BLUE SHIELD

## 2016-01-04 ENCOUNTER — Other Ambulatory Visit: Payer: Self-pay | Admitting: Family Medicine

## 2016-01-04 ENCOUNTER — Encounter: Payer: Self-pay | Admitting: Family Medicine

## 2016-01-04 DIAGNOSIS — Q998 Other specified chromosome abnormalities: Secondary | ICD-10-CM | POA: Diagnosis not present

## 2016-01-04 DIAGNOSIS — R739 Hyperglycemia, unspecified: Secondary | ICD-10-CM | POA: Diagnosis not present

## 2016-01-04 DIAGNOSIS — E119 Type 2 diabetes mellitus without complications: Secondary | ICD-10-CM

## 2016-01-04 DIAGNOSIS — Q738 Other reduction defects of unspecified limb(s): Secondary | ICD-10-CM

## 2016-01-04 HISTORY — DX: Type 2 diabetes mellitus without complications: E11.9

## 2016-01-04 LAB — HEMOGLOBIN A1C: Hgb A1c MFr Bld: 6.6 % — ABNORMAL HIGH (ref 4.6–6.5)

## 2016-01-04 MED ORDER — DESLORATADINE 5 MG PO TABS: 5.0000 mg | ORAL_TABLET | Freq: Every day | ORAL | 6 refills | Status: DC

## 2016-01-05 ENCOUNTER — Other Ambulatory Visit: Payer: Self-pay | Admitting: Family Medicine

## 2016-01-05 DIAGNOSIS — E785 Hyperlipidemia, unspecified: Secondary | ICD-10-CM

## 2016-01-05 DIAGNOSIS — E119 Type 2 diabetes mellitus without complications: Secondary | ICD-10-CM

## 2016-01-12 ENCOUNTER — Other Ambulatory Visit: Payer: Self-pay

## 2016-01-12 MED ORDER — DESLORATADINE 5 MG PO TABS: 5.0000 mg | ORAL_TABLET | Freq: Every day | ORAL | 1 refills | Status: DC

## 2016-01-12 NOTE — Telephone Encounter (Signed)
Patient request for pharmacy change with refill for clarinex. Sent to [facility].

## 2016-01-15 ENCOUNTER — Other Ambulatory Visit: Payer: Self-pay | Admitting: Family Medicine

## 2016-01-15 MED ORDER — DESLORATADINE 5 MG PO TABS: 5.0000 mg | ORAL_TABLET | Freq: Every day | ORAL | 1 refills | Status: DC

## 2016-01-29 ENCOUNTER — Other Ambulatory Visit: Payer: Self-pay | Admitting: Family Medicine

## 2016-01-29 MED ORDER — GLUCOSE BLOOD VI STRP: ORAL_STRIP | 4 refills | Status: DC

## 2016-02-09 ENCOUNTER — Ambulatory Visit: Payer: BLUE CROSS/BLUE SHIELD

## 2016-02-15 DIAGNOSIS — M5030 Other cervical disc degeneration, unspecified cervical region: Secondary | ICD-10-CM | POA: Diagnosis not present

## 2016-02-15 DIAGNOSIS — M791 Myalgia: Secondary | ICD-10-CM | POA: Diagnosis not present

## 2016-02-16 ENCOUNTER — Ambulatory Visit: Payer: BLUE CROSS/BLUE SHIELD

## 2016-02-23 ENCOUNTER — Ambulatory Visit: Payer: BLUE CROSS/BLUE SHIELD

## 2016-02-23 ENCOUNTER — Telehealth: Payer: Self-pay | Admitting: Family Medicine

## 2016-02-23 NOTE — Telephone Encounter (Signed)
Patient called stating that she has some of her RX's need to be changed due to [facility] .  Can you please look notes on your desk and let me know if patient needs OV or if you can Rx something?  Thanks.

## 2016-02-25 MED ORDER — DULOXETINE HCL 30 MG PO CPEP: 30.0000 mg | ORAL_CAPSULE | Freq: Every day | ORAL | 1 refills | Status: DC

## 2016-02-25 NOTE — Telephone Encounter (Signed)
Patient aware of new medication instructions, expressed understanding.  Appt scheduled 03/28/16.

## 2016-02-25 NOTE — Telephone Encounter (Signed)
I sent in eRx for cymbalta. She can start this now and she can simply stop taking her citalopram. No other new med recommendations at this time. F/u in office in 3-4 weeks.-thx

## 2016-03-07 DIAGNOSIS — M542 Cervicalgia: Secondary | ICD-10-CM | POA: Diagnosis not present

## 2016-03-07 DIAGNOSIS — M50322 Other cervical disc degeneration at C5-C6 level: Secondary | ICD-10-CM | POA: Diagnosis not present

## 2016-03-07 DIAGNOSIS — Z79891 Long term (current) use of opiate analgesic: Secondary | ICD-10-CM | POA: Diagnosis not present

## 2016-03-07 DIAGNOSIS — G894 Chronic pain syndrome: Secondary | ICD-10-CM | POA: Diagnosis not present

## 2016-03-28 ENCOUNTER — Ambulatory Visit (INDEPENDENT_AMBULATORY_CARE_PROVIDER_SITE_OTHER): Payer: BLUE CROSS/BLUE SHIELD | Admitting: Family Medicine

## 2016-03-28 ENCOUNTER — Encounter: Payer: Self-pay | Admitting: Family Medicine

## 2016-03-28 VITALS — BP 128/83 | HR 76 | Temp 98.1°F | Resp 16 | Ht 61.0 in | Wt 200.8 lb

## 2016-03-28 DIAGNOSIS — F32A Depression, unspecified: Secondary | ICD-10-CM

## 2016-03-28 DIAGNOSIS — F329 Major depressive disorder, single episode, unspecified: Secondary | ICD-10-CM

## 2016-03-28 DIAGNOSIS — M797 Fibromyalgia: Secondary | ICD-10-CM

## 2016-03-28 DIAGNOSIS — E7800 Pure hypercholesterolemia, unspecified: Secondary | ICD-10-CM

## 2016-03-28 DIAGNOSIS — F418 Other specified anxiety disorders: Secondary | ICD-10-CM

## 2016-03-28 DIAGNOSIS — F419 Anxiety disorder, unspecified: Principal | ICD-10-CM

## 2016-03-28 MED ORDER — ATORVASTATIN CALCIUM 20 MG PO TABS: 20.0000 mg | ORAL_TABLET | Freq: Every day | ORAL | 3 refills | Status: DC

## 2016-03-28 MED ORDER — DULOXETINE HCL 30 MG PO CPEP: 30.0000 mg | ORAL_CAPSULE | Freq: Every day | ORAL | 3 refills | Status: DC

## 2016-03-28 NOTE — Progress Notes (Signed)
Pre visit review using our clinic review tool, if applicable. No additional management support is needed unless otherwise documented below in the visit note. 

## 2016-03-28 NOTE — Progress Notes (Signed)
OFFICE VISIT  03/28/2016   CC:  Chief Complaint  Patient presents with  . Follow-up   HPI:    Patient is a 55 y.o. Caucasian female who presents for f/u depression and fibromyalgia. Cymbalta started a few months ago, pt feels improved regarding mood and pain.  Ran out of it 1 week ago and feels like some pain symptoms are returning.    I also started her on lipitor last visit and she has no side effects. She is not fasting today.  Past Medical History:  Diagnosis Date  . Chronic neck pain    plan for MRI neck by Dr. Zachery DauerBarnes as of 09/09/13  . Diabetes mellitus without complication (HCC) 01/04/2016   Dx'd with elevated random gluc + HbA1c 6.6%.  Marland Kitchen. GAD (generalized anxiety disorder)    with anger control problems  . GERD (gastroesophageal reflux disease)   . History of pneumonia 2010   with hemoptysis; Pulm eval done, f/u CXR 04/01/09 after pneumonia was normal.  . History of rheumatic fever    childhood--she reports no CV or Renal sequelae  . Hyperlipidemia 12/2015   started statin 12/2015  . IBS (irritable bowel syndrome)    Diarrhea predominant  . Impingement syndrome of right shoulder   . Polyarthralgia 2017   Rheum eval (Dr. Nickola MajorHawkes) 10/2015: no inflammatory arthritis suspected.  . Tobacco abuse    30+ pack-yr hx  . Venous insufficiency 01/2012    Past Surgical History:  Procedure Laterality Date  . CHOLECYSTECTOMY  2011  . COLONOSCOPY  approx 2007   Dr. Jarold MottoPatterson (normal per pt--cannot locate path or procedure record)  . ESOPHAGOGASTRODUODENOSCOPY  "   "   Mild esophagogastric junction inflammation, otherwise normal.  . OVARY SURGERY  1993   left (ovarian cyst)  . VAGINAL HYSTERECTOMY  2003   Nonmalignant reasons (DUB)    Outpatient Medications Prior to Visit  Medication Sig Dispense Refill  . ALPRAZolam (XANAX) 0.25 MG tablet 1-2 tabs po bid prn anxiety 60 tablet 5  . aluminum chloride (DRYSOL) 20 % external solution Apply topically as needed.    .  desloratadine (CLARINEX) 5 MG tablet Take 1 tablet (5 mg total) by mouth daily. 90 tablet 1  . glucose blood test strip Use as instructed 100 each 4  . HYDROcodone-acetaminophen (NORCO/VICODIN) 5-325 MG per tablet 1-2 tabs po q6h prn pain 30 tablet 0  . atorvastatin (LIPITOR) 20 MG tablet Take 1 tablet (20 mg total) by mouth daily. 30 tablet 3  . DULoxetine (CYMBALTA) 30 MG capsule Take 1 capsule (30 mg total) by mouth daily. 30 capsule 1  . albuterol (PROAIR HFA) 108 (90 BASE) MCG/ACT inhaler Inhale 2 puffs into the lungs every 6 (six) hours as needed for wheezing. 1 Inhaler 0  . furosemide (LASIX) 20 MG tablet Take 1 tablet (20 mg total) by mouth daily as needed. 10 tablet 0  . citalopram (CELEXA) 20 MG tablet Take 1 tablet (20 mg total) by mouth daily. (Patient not taking: Reported on 03/28/2016) 90 tablet 3  . fluticasone (CUTIVATE) 0.05 % cream Apply to affected areas bid prn (Patient not taking: Reported on 03/28/2016) 90 g 1   No facility-administered medications prior to visit.     No Known Allergies  ROS As per HPI  PE: Blood pressure 128/83, pulse 76, temperature 98.1 F (36.7 C), temperature source Oral, resp. rate 16, height 5\' 1"  (1.549 m), weight 200 lb 12 oz (91.1 kg), SpO2 98 %. Gen: Alert, well appearing.  Patient is oriented to person, place, time, and situation. AFFECT: pleasant, lucid thought and speech. No further exam today.  LABS:  Lab Results  Component Value Date   HGBA1C 6.6 (H) 01/04/2016    IMPRESSION AND PLAN:  1) Fibromyalgia: improved with cymbalta 30mg  qd.  We'll continue her on this dosing since her response was good and it has been > 1 week since she last took this med.  Told pt to call and let me know if response seems to wain over time and I would increase her dosing to 60mg  qd.  2) Depression: improved on cymbalta 30mg  qd.  3) Hyperlipidemia: tolerating statin.  Needs f/u lipid panel.  We'll have her come to her next f/u visit fasting in 3 mo  and we'll repeat lipid panel at that time.  An After Visit Summary was printed and given to the patient.  FOLLOW UP: Return in about 3 months (around 06/26/2016) for routine chronic illness f/u--fasting.  Needs HbA1c, FLP, CMET at that time.  Signed:  Santiago BumpersPhil Leeroy Lovings, MD           03/28/2016

## 2016-04-08 ENCOUNTER — Telehealth: Payer: Self-pay | Admitting: *Deleted

## 2016-04-08 NOTE — Telephone Encounter (Signed)
Left detailed message on cell vm, okay per DPR.  

## 2016-04-08 NOTE — Telephone Encounter (Signed)
Tell her I am sorry but I can't do this.   She needs to make an appointment with Dr. Ethelene Halamos rather than calling him.-thx

## 2016-04-08 NOTE — Telephone Encounter (Signed)
Pt LMOM on 04/08/16 at 2:45pm stating that she would like Dr. Milinda CaveMcGowen to contact Dr. Ethelene Halamos in regards to her pain medication for fibromyalgia, neck and back pain. She stated that she has left messages for Dr. Ethelene Halamos to caller her but they have not contacted her. She stated that she is in a lot of pain and feels like she is going to lose her mind if something isn't done. She stated that she needs some relief. She wants to know if her medication can either be increased or changed to a time release medication. She stated that she is currently prescribed hydrocodone 1 tab every 4 hours and this is not helping. Please advise. Thanks.

## 2016-04-26 ENCOUNTER — Emergency Department (HOSPITAL_COMMUNITY)
Admission: EM | Admit: 2016-04-26 | Discharge: 2016-04-26 | Disposition: A | Discharge status: Home / Self Care | Payer: Self-pay | Attending: Emergency Medicine | Admitting: Emergency Medicine

## 2016-04-26 ENCOUNTER — Emergency Department (HOSPITAL_COMMUNITY): Payer: Self-pay

## 2016-04-26 ENCOUNTER — Encounter (HOSPITAL_COMMUNITY): Payer: Self-pay | Admitting: Emergency Medicine

## 2016-04-26 DIAGNOSIS — F17210 Nicotine dependence, cigarettes, uncomplicated: Secondary | ICD-10-CM | POA: Insufficient documentation

## 2016-04-26 DIAGNOSIS — S3992XA Unspecified injury of lower back, initial encounter: Secondary | ICD-10-CM | POA: Diagnosis not present

## 2016-04-26 DIAGNOSIS — Y999 Unspecified external cause status: Secondary | ICD-10-CM | POA: Insufficient documentation

## 2016-04-26 DIAGNOSIS — Y939 Activity, unspecified: Secondary | ICD-10-CM | POA: Insufficient documentation

## 2016-04-26 DIAGNOSIS — M545 Low back pain: Secondary | ICD-10-CM | POA: Insufficient documentation

## 2016-04-26 DIAGNOSIS — E119 Type 2 diabetes mellitus without complications: Secondary | ICD-10-CM | POA: Insufficient documentation

## 2016-04-26 DIAGNOSIS — Y929 Unspecified place or not applicable: Secondary | ICD-10-CM | POA: Insufficient documentation

## 2016-04-26 DIAGNOSIS — W1789XA Other fall from one level to another, initial encounter: Secondary | ICD-10-CM | POA: Insufficient documentation

## 2016-04-26 DIAGNOSIS — M549 Dorsalgia, unspecified: Secondary | ICD-10-CM

## 2016-04-26 MED ORDER — KETOROLAC TROMETHAMINE 60 MG/2ML IM SOLN
60.0000 mg | Freq: Once | INTRAMUSCULAR | Status: AC
  Administered 2016-04-26: 60 mg via INTRAMUSCULAR
  Filled 2016-04-26: qty 2

## 2016-04-26 MED ORDER — DIAZEPAM 5 MG/ML IJ SOLN
5.0000 mg | Freq: Once | INTRAMUSCULAR | Status: AC
  Administered 2016-04-26: 5 mg via INTRAMUSCULAR
  Filled 2016-04-26: qty 2

## 2016-04-26 MED ORDER — NAPROXEN 500 MG PO TABS: 500.0000 mg | ORAL_TABLET | Freq: Two times a day (BID) | ORAL | 0 refills | Status: DC

## 2016-04-26 MED ORDER — METHOCARBAMOL 500 MG PO TABS: 500.0000 mg | ORAL_TABLET | Freq: Two times a day (BID) | ORAL | 0 refills | Status: DC

## 2016-04-26 NOTE — ED Provider Notes (Signed)
[facility]-EMERGENCY DEPT Provider Note    By signing my name below, I, Earmon Phoenix, attest that this documentation has been prepared under the direction and in the presence of Sharilyn Sites, PA-C. Electronically Signed: Earmon Phoenix, ED Scribe. 04/26/16. 11:40 AM.    History   Chief Complaint Chief Complaint  Patient presents with  . Back Pain   The history is provided by the patient and medical records. No language interpreter was used.    HPI Comments:  [name] is a 56 y.o. female with PMHx of chronic neck pain who presents to the Emergency Department complaining of worsening, nonradiating right sided low back pain that began approximately 5 weeks ago secondary to falling off a stool. She states when she fell off the stool she hit her back on the bath tub. She states she currently sees Dr. Ethelene Hal for her chronic neck pain and has received two rounds of Prednisone which has not helped.  She was not evaluated in the office, she just sent him emails about it. She reports taking Vicodin with no significant relief either. She has not had any imaging since fall.  Movements and palpation increase the pain. She denies alleviating factors. She denies head injury, LOC, numbness, tingling or weakness of the lower extremities, bowel or bladder incontinence, fever, chills, nausea, vomiting, bruising or wounds.   Past Medical History:  Diagnosis Date  . Chronic neck pain    plan for MRI neck by Dr. Zachery Dauer as of 09/09/13  . Diabetes mellitus without complication (HCC) 01/04/2016   Dx'd with elevated random gluc + HbA1c 6.6%.  Marland Kitchen GAD (generalized anxiety disorder)    with anger control problems  . GERD (gastroesophageal reflux disease)   . History of pneumonia 2010   with hemoptysis; Pulm eval done, f/u CXR 04/01/09 after pneumonia was normal.  . History of rheumatic fever    childhood--she reports no CV or Renal sequelae  . Hyperlipidemia 12/2015   started statin 12/2015  . IBS  (irritable bowel syndrome)    Diarrhea predominant  . Impingement syndrome of right shoulder   . Polyarthralgia 2017   Rheum eval (Dr. Nickola Major) 10/2015: no inflammatory arthritis suspected.  . Tobacco abuse    30+ pack-yr hx  . Venous insufficiency 01/2012    Patient Active Problem List   Diagnosis Date Noted  . Right shoulder pain 08/02/2013  . Abdominal pain, left lower quadrant 10/04/2012  . Bilateral lower extremity edema 12/20/2011  . 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 GAD (generalized anxiety disorder) 09/09/2011  . GERD 05/14/2009    Past Surgical History:  Procedure Laterality Date  . CHOLECYSTECTOMY  2011  . COLONOSCOPY  approx 2007   Dr. Jarold Motto (normal per pt--cannot locate path or procedure record)  . ESOPHAGOGASTRODUODENOSCOPY  "   "   Mild esophagogastric junction inflammation, otherwise normal.  . OVARY SURGERY  1993   left (ovarian cyst)  . VAGINAL HYSTERECTOMY  2003   Nonmalignant reasons (DUB)    OB History    No data available       Home Medications    Prior to Admission medications   Medication Sig Start Date End Date Taking? Authorizing Provider  albuterol (PROAIR HFA) 108 (90 BASE) MCG/ACT inhaler Inhale 2 puffs into the lungs every 6 (six) hours as needed for wheezing. 08/02/11 10/04/12  Jeoffrey Massed, MD  ALPRAZolam Prudy Feeler) 0.25 MG tablet 1-2 tabs po bid prn anxiety 12/14/15   Jeoffrey Massed, MD  aluminum chloride (DRYSOL) 20 %  external solution Apply topically as needed.    Historical Provider, MD  atorvastatin (LIPITOR) 20 MG tablet Take 1 tablet (20 mg total) by mouth daily. 03/28/16   Jeoffrey Massed, MD  desloratadine (CLARINEX) 5 MG tablet Take 1 tablet (5 mg total) by mouth daily. 01/15/16   Jeoffrey Massed, MD  DULoxetine (CYMBALTA) 30 MG capsule Take 1 capsule (30 mg total) by mouth daily. 03/28/16   Jeoffrey Massed, MD  furosemide (LASIX) 20 MG tablet Take 1 tablet (20 mg total) by mouth daily as needed. 12/20/11 12/19/12  Rodolph Bong,  MD  glucose blood test strip Use as instructed 01/29/16   Jeoffrey Massed, MD  HYDROcodone-acetaminophen (NORCO/VICODIN) 5-325 MG per tablet 1-2 tabs po q6h prn pain 08/14/13   Jeoffrey Massed, MD    Family History Family History  Problem Relation Age of Onset  . Alcohol abuse Father   . Heart disease Maternal Grandfather     Social History Social History  Substance Use Topics  . Smoking status: Light Tobacco Smoker    Types: Cigarettes  . Smokeless tobacco: Never Used  . Alcohol use Yes     Comment: occ wine     Allergies   Patient has no known allergies.   Review of Systems Review of Systems  Constitutional: Negative for chills and fever.  Gastrointestinal: Negative for nausea and vomiting.       No bowel or bladder incontinence  Musculoskeletal: Positive for back pain.  Skin: Negative for color change and wound.  Neurological: Negative for weakness and numbness.  All other systems reviewed and are negative.    Physical Exam Updated Vital Signs BP 164/97 (BP Location: Right Arm)   Pulse 90   Temp 98.3 F (36.8 C) (Oral)   Resp 18   SpO2 100%   Physical Exam  Constitutional: She is oriented to person, place, and time. She appears well-developed and well-nourished.  HENT:  Head: Normocephalic and atraumatic.  Mouth/Throat: Oropharynx is clear and moist.  Eyes: Conjunctivae and EOM are normal. Pupils are equal, round, and reactive to light.  Neck: Normal range of motion.  Cardiovascular: Normal rate, regular rhythm and normal heart sounds.   Pulmonary/Chest: Effort normal and breath sounds normal.  Abdominal: Soft. Bowel sounds are normal.  Musculoskeletal: Normal range of motion.  Tenderness of right lumbar paraspinal region; no midline step-off or deformity; full ROM maintained, normal strength and sensation of both legs, normal gait  Neurological: She is alert and oriented to person, place, and time.  Skin: Skin is warm and dry.  Psychiatric: She has a  normal mood and affect.  Nursing note and vitals reviewed.    ED Treatments / Results  DIAGNOSTIC STUDIES: Oxygen Saturation is 100% on RA, normal by my interpretation.   COORDINATION OF CARE: 10:41 AM- Will order L spine X-Ray, injection of Toradol and Valium. Pt verbalizes understanding and agrees to plan.  Medications  ketorolac (TORADOL) injection 60 mg (60 mg Intramuscular Given 04/26/16 1100)  diazepam (VALIUM) injection 5 mg (5 mg Intramuscular Given 04/26/16 1059)    Labs (all labs ordered are listed, but only abnormal results are displayed) Labs Reviewed - No data to display  EKG  EKG Interpretation None       Radiology Dg Lumbar Spine Complete  Result Date: 04/26/2016 CLINICAL DATA:  56 year old female status post fall at Thanksgiving with ongoing and progressive lumbar back pain radiating to the left leg. Initial encounter. EXAM: LUMBAR SPINE - COMPLETE  4+ VIEW COMPARISON:  Chest radiographs 04/01/2009. FINDINGS: Stable cholecystectomy clips. Normal lumbar segmentation. Chronic levoconvex thoracic in dextroconvex lumbar scoliosis. Straightening of lumbar lordosis on the lateral view. No lumbar compression fracture. Mild to moderate disc space loss L3-L4 and L4-L5. Relatively preserved disc spaces elsewhere. No pars fracture or spondylolisthesis. Visible lower thoracic levels appear intact. SI joints appear normal. No sacral fracture is evident. Aortoiliac calcified atherosclerosis noted. Visible pelvis appears grossly intact. IMPRESSION: 1. No acute fracture or listhesis identified in the lumbar spine. Chronic thoracolumbar scoliosis. 2. Evidence of chronic L3-L4 and L4-L5 disc degeneration. 3.  Calcified aortic atherosclerosis. Electronically Signed   By: Odessa FlemingH  Hall M.D.   On: 04/26/2016 11:23    Procedures Procedures (including critical care time)  Medications Ordered in ED Medications  ketorolac (TORADOL) injection 60 mg (60 mg Intramuscular Given 04/26/16 1100)  diazepam  (VALIUM) injection 5 mg (5 mg Intramuscular Given 04/26/16 1059)     Initial Impression / Assessment and Plan / ED Course  I have reviewed the triage vital signs and the nursing notes.  Pertinent labs & imaging results that were available during my care of the patient were reviewed by me and considered in my medical decision making (see chart for details).  Clinical Course    56 year old female here with right lower back pain for the past several weeks. She fell around Thanksgiving, but was never evaluated for this. She is in pain management with Dr. Ethelene Halamos. On exam she has tenderness of the right lumbar paraspinal region without noted midline step-off or bony deformity. She has no focal neurologic deficits to suggest cauda equina. Given her persistent pain after fall, imaging was obtained which is negative for acute findings, she does have chronic disc degeneration which is known. She was treated here with Toradol and Valium. Will discharge home with Robaxin and Naprosyn which she may take in addition to her hydrocodone. I recommended that she follow-up with Dr. Ethelene Halamos as soon as possible.  Discussed plan with patient, she acknowledged understanding and agreed with plan of care.  Return precautions given for new or worsening symptoms.  I personally performed the services described in this documentation, which was scribed in my presence. The recorded information has been reviewed and is accurate.  Final Clinical Impressions(s) / ED Diagnoses   Final diagnoses:  Right-sided back pain, unspecified back location, unspecified chronicity    New Prescriptions New Prescriptions   METHOCARBAMOL (ROBAXIN) 500 MG TABLET    Take 1 tablet (500 mg total) by mouth 2 (two) times daily.   NAPROXEN (NAPROSYN) 500 MG TABLET    Take 1 tablet (500 mg total) by mouth 2 (two) times daily with a meal.     Garlon HatchetLisa M Taegen Lennox, PA-C 04/26/16 1156    Raeford RazorStephen Kohut, MD 04/28/16 1356

## 2016-04-26 NOTE — ED Triage Notes (Signed)
Per pt, states she fell around thanksgiving-states she injured right lower back-has been on 2 rounds of Prednisone which didn't help-states she takes hydrocodone for neck and it hasn't helped-states she already took 2 pain pills with no relief

## 2016-04-26 NOTE — Discharge Instructions (Signed)
Take the prescribed medication as directed.  May take these with your hydrocodone. Follow-up with Dr. Ethelene Halamos. Return to the ED for new or worsening symptoms.

## 2016-04-28 ENCOUNTER — Other Ambulatory Visit: Payer: Self-pay | Admitting: Physical Medicine and Rehabilitation

## 2016-04-28 DIAGNOSIS — M5136 Other intervertebral disc degeneration, lumbar region: Secondary | ICD-10-CM

## 2016-05-03 ENCOUNTER — Ambulatory Visit
Admission: RE | Admit: 2016-05-03 | Discharge: 2016-05-03 | Disposition: A | Discharge status: Home / Self Care | Payer: BLUE CROSS/BLUE SHIELD | Source: Ambulatory Visit | Attending: Physical Medicine and Rehabilitation | Admitting: Physical Medicine and Rehabilitation

## 2016-05-03 DIAGNOSIS — M48061 Spinal stenosis, lumbar region without neurogenic claudication: Secondary | ICD-10-CM | POA: Diagnosis not present

## 2016-05-03 DIAGNOSIS — M5136 Other intervertebral disc degeneration, lumbar region: Secondary | ICD-10-CM

## 2016-06-04 DIAGNOSIS — G894 Chronic pain syndrome: Secondary | ICD-10-CM | POA: Diagnosis not present

## 2016-06-04 DIAGNOSIS — M5126 Other intervertebral disc displacement, lumbar region: Secondary | ICD-10-CM | POA: Diagnosis not present

## 2016-06-04 DIAGNOSIS — Z79891 Long term (current) use of opiate analgesic: Secondary | ICD-10-CM | POA: Diagnosis not present

## 2016-06-04 DIAGNOSIS — M50322 Other cervical disc degeneration at C5-C6 level: Secondary | ICD-10-CM | POA: Diagnosis not present

## 2016-06-08 ENCOUNTER — Encounter: Payer: Self-pay | Admitting: Family Medicine

## 2016-06-23 ENCOUNTER — Other Ambulatory Visit (INDEPENDENT_AMBULATORY_CARE_PROVIDER_SITE_OTHER): Payer: BLUE CROSS/BLUE SHIELD

## 2016-06-23 ENCOUNTER — Ambulatory Visit: Payer: BLUE CROSS/BLUE SHIELD | Admitting: Family Medicine

## 2016-06-23 DIAGNOSIS — E119 Type 2 diabetes mellitus without complications: Secondary | ICD-10-CM

## 2016-06-23 DIAGNOSIS — E875 Hyperkalemia: Secondary | ICD-10-CM

## 2016-06-23 DIAGNOSIS — E7800 Pure hypercholesterolemia, unspecified: Secondary | ICD-10-CM

## 2016-06-23 DIAGNOSIS — R7989 Other specified abnormal findings of blood chemistry: Secondary | ICD-10-CM

## 2016-06-23 LAB — LIPID PANEL
Cholesterol: 171 mg/dL (ref 0–200)
HDL: 40.8 mg/dL (ref >39.00)
NONHDL: 130.03
TRIGLYCERIDES: 312 mg/dL — AB (ref 0.0–149.0)
Total CHOL/HDL Ratio: 4
VLDL: 62.4 mg/dL — ABNORMAL HIGH (ref 0.0–40.0)

## 2016-06-23 LAB — BASIC METABOLIC PANEL
BUN: 16 mg/dL (ref 6–23)
CALCIUM: 9.1 mg/dL (ref 8.4–10.5)
CO2: 29 meq/L (ref 19–32)
CREATININE: 0.59 mg/dL (ref 0.40–1.20)
Chloride: 105 mEq/L (ref 96–112)
GFR: 112.36 mL/min (ref >60.00)
GLUCOSE: 119 mg/dL — AB (ref 70–99)
Potassium: 4.4 mEq/L (ref 3.5–5.1)
Sodium: 139 mEq/L (ref 135–145)

## 2016-06-23 LAB — LDL CHOLESTEROL, DIRECT: Direct LDL: 93 mg/dL

## 2016-06-23 LAB — HEMOGLOBIN A1C: HEMOGLOBIN A1C: 7.5 % — AB (ref 4.6–6.5)

## 2016-06-23 NOTE — Progress Notes (Deleted)
OFFICE VISIT  06/23/2016   CC: No chief complaint on file.    HPI:    Patient is a 56 y.o. Caucasian female who presents for 3 mo f/u depression and fibromyalgia, diet controlled DM 2, hyperlipidemia, and mild hyperkalemia.  Past Medical History:  Diagnosis Date  . Chronic neck pain    plan for MRI neck by Dr. Zachery Dauer as of 09/09/13  . DDD (degenerative disc disease), lumbar    MRI 05/13/16: mild DDD at L3-4, and ruptured disc at L4-5 to the right.  Dr. Ethelene Hal recommended right L5 selective nerve root block + referral to Dr. Darrelyn Hillock for surgery (as of 06/04/16 f/u).  . Diabetes mellitus without complication (HCC) 01/04/2016   Dx'd with elevated random gluc + HbA1c 6.6%.  Marland Kitchen GAD (generalized anxiety disorder)    with anger control problems  . GERD (gastroesophageal reflux disease)   . History of pneumonia 2010   with hemoptysis; Pulm eval done, f/u CXR 04/01/09 after pneumonia was normal.  . History of rheumatic fever    childhood--she reports no CV or Renal sequelae  . Hyperlipidemia 12/2015   started statin 12/2015  . IBS (irritable bowel syndrome)    Diarrhea predominant  . Impingement syndrome of right shoulder   . Polyarthralgia 2017   Rheum eval (Dr. Nickola Major) 10/2015: no inflammatory arthritis suspected.  . Tobacco abuse    30+ pack-yr hx  . Venous insufficiency 01/2012    Past Surgical History:  Procedure Laterality Date  . CHOLECYSTECTOMY  2011  . COLONOSCOPY  approx 2007   Dr. Jarold Motto (normal per pt--cannot locate path or procedure record)  . ESOPHAGOGASTRODUODENOSCOPY  "   "   Mild esophagogastric junction inflammation, otherwise normal.  . OVARY SURGERY  1993   left (ovarian cyst)  . VAGINAL HYSTERECTOMY  2003   Nonmalignant reasons (DUB)    Outpatient Medications Prior to Visit  Medication Sig Dispense Refill  . albuterol (PROAIR HFA) 108 (90 BASE) MCG/ACT inhaler Inhale 2 puffs into the lungs every 6 (six) hours as needed for wheezing. 1 Inhaler 0  .  ALPRAZolam (XANAX) 0.25 MG tablet 1-2 tabs po bid prn anxiety 60 tablet 5  . aluminum chloride (DRYSOL) 20 % external solution Apply topically as needed.    Marland Kitchen atorvastatin (LIPITOR) 20 MG tablet Take 1 tablet (20 mg total) by mouth daily. 90 tablet 3  . desloratadine (CLARINEX) 5 MG tablet Take 1 tablet (5 mg total) by mouth daily. 90 tablet 1  . DULoxetine (CYMBALTA) 30 MG capsule Take 1 capsule (30 mg total) by mouth daily. 90 capsule 3  . furosemide (LASIX) 20 MG tablet Take 1 tablet (20 mg total) by mouth daily as needed. 10 tablet 0  . glucose blood test strip Use as instructed 100 each 4  . HYDROcodone-acetaminophen (NORCO/VICODIN) 5-325 MG per tablet 1-2 tabs po q6h prn pain 30 tablet 0  . methocarbamol (ROBAXIN) 500 MG tablet Take 1 tablet (500 mg total) by mouth 2 (two) times daily. 20 tablet 0  . naproxen (NAPROSYN) 500 MG tablet Take 1 tablet (500 mg total) by mouth 2 (two) times daily with a meal. 30 tablet 0   No facility-administered medications prior to visit.     No Known Allergies  ROS As per HPI  PE: There were no vitals taken for this visit. ***  LABS:  Lab Results  Component Value Date   TSH 1.90 01/01/2016   Lab Results  Component Value Date   WBC 10.5 01/01/2016  HGB 14.8 01/01/2016   HCT 43.6 01/01/2016   MCV 89.4 01/01/2016   PLT 331.0 01/01/2016   Lab Results  Component Value Date   CREATININE 0.65 01/01/2016   BUN 18 01/01/2016   NA 141 01/01/2016   K 5.2 (H) 01/01/2016   CL 101 01/01/2016   CO2 33 (H) 01/01/2016   Lab Results  Component Value Date   ALT 28 01/01/2016   AST 20 01/01/2016   ALKPHOS 76 01/01/2016   BILITOT 0.2 01/01/2016   Lab Results  Component Value Date   HGBA1C 6.6 (H) 01/04/2016    IMPRESSION AND PLAN:  No problem-specific Assessment & Plan notes found for this encounter.   FOLLOW UP: No Follow-up on file.

## 2016-07-06 ENCOUNTER — Other Ambulatory Visit: Payer: Self-pay | Admitting: Family Medicine

## 2016-07-06 MED ORDER — METFORMIN HCL 500 MG PO TABS: 500.0000 mg | ORAL_TABLET | Freq: Two times a day (BID) | ORAL | 3 refills | Status: DC

## 2016-08-08 ENCOUNTER — Telehealth: Payer: Self-pay | Admitting: Family Medicine

## 2016-08-08 NOTE — Telephone Encounter (Signed)
RF request for alprazolam LOV: 03/28/16 Next ov: None Last written: 12/14/15 #60 w/ 5RF  Pt also requesting Rx for nasal spray like Flonase.   Please advise. Thanks.

## 2016-08-08 NOTE — Telephone Encounter (Signed)
Patient requesting xanax refill and RX for something like flonase ( it's cheaper if she has an RX than OTC )?     Please advise.

## 2016-08-09 MED ORDER — ALPRAZOLAM 0.25 MG PO TABS: ORAL_TABLET | ORAL | 5 refills | Status: DC

## 2016-08-09 MED ORDER — FLUTICASONE PROPIONATE 50 MCG/ACT NA SUSP: 2.0000 | Freq: Every day | NASAL | 6 refills | Status: DC

## 2016-08-09 NOTE — Telephone Encounter (Signed)
Left message for pt to call back  °

## 2016-08-09 NOTE — Telephone Encounter (Signed)
Xanax rx printed. Flonase eRx'd.

## 2016-08-10 NOTE — Telephone Encounter (Signed)
Advised patient of Rx's at pharmacy.

## 2016-08-11 NOTE — Telephone Encounter (Signed)
SW pt Rx has been faxed to [facility].

## 2016-09-07 ENCOUNTER — Telehealth: Payer: Self-pay | Admitting: Family Medicine

## 2016-09-07 MED ORDER — CITALOPRAM HYDROBROMIDE 20 MG PO TABS: 20.0000 mg | ORAL_TABLET | Freq: Every day | ORAL | 0 refills | Status: DC

## 2016-09-07 NOTE — Telephone Encounter (Signed)
Please advise. Thanks.  

## 2016-09-07 NOTE — Telephone Encounter (Signed)
Patient wants to see if she can switch back to citalopram instead of cymbalta?  Patient states she is "acting out" again at work and she is afraid she is going to get fired.  Patient states that she does have citalopram left, she just wants to know how she can get back on it instead of cymbalta?  Please advise.

## 2016-09-07 NOTE — Telephone Encounter (Signed)
Pt advised and voiced understanding.   

## 2016-09-07 NOTE — Telephone Encounter (Signed)
OK to stop cymbalta.  The next day she restart her citalopram at 20mg  once daily.

## 2016-09-22 DIAGNOSIS — G894 Chronic pain syndrome: Secondary | ICD-10-CM | POA: Diagnosis not present

## 2016-09-22 DIAGNOSIS — M50322 Other cervical disc degeneration at C5-C6 level: Secondary | ICD-10-CM | POA: Diagnosis not present

## 2016-09-22 DIAGNOSIS — Z79891 Long term (current) use of opiate analgesic: Secondary | ICD-10-CM | POA: Diagnosis not present

## 2016-09-27 ENCOUNTER — Encounter: Payer: Self-pay | Admitting: Family Medicine

## 2016-11-15 ENCOUNTER — Other Ambulatory Visit: Payer: Self-pay | Admitting: Family Medicine

## 2017-01-17 DIAGNOSIS — M50322 Other cervical disc degeneration at C5-C6 level: Secondary | ICD-10-CM | POA: Diagnosis not present

## 2017-01-17 DIAGNOSIS — G894 Chronic pain syndrome: Secondary | ICD-10-CM | POA: Diagnosis not present

## 2017-01-23 ENCOUNTER — Encounter: Payer: Self-pay | Admitting: Family Medicine

## 2017-01-30 ENCOUNTER — Other Ambulatory Visit: Payer: Self-pay | Admitting: Family Medicine

## 2017-01-30 NOTE — Telephone Encounter (Signed)
Left detailed message for pt to schedule an appointment for further refills of Citalopram, okay per DPR.  If patient is completely out of medication I can send in a weeks supply since Dr Milinda Cave is out of the office this week.   Advised patient in message to schedule or talk with me regarding rf.

## 2017-02-06 DIAGNOSIS — M542 Cervicalgia: Secondary | ICD-10-CM | POA: Diagnosis not present

## 2017-02-06 DIAGNOSIS — Z79899 Other long term (current) drug therapy: Secondary | ICD-10-CM | POA: Diagnosis not present

## 2017-02-06 DIAGNOSIS — Z79891 Long term (current) use of opiate analgesic: Secondary | ICD-10-CM | POA: Diagnosis not present

## 2017-02-06 DIAGNOSIS — M5136 Other intervertebral disc degeneration, lumbar region: Secondary | ICD-10-CM | POA: Diagnosis not present

## 2017-02-06 DIAGNOSIS — M47817 Spondylosis without myelopathy or radiculopathy, lumbosacral region: Secondary | ICD-10-CM | POA: Diagnosis not present

## 2017-02-06 DIAGNOSIS — G894 Chronic pain syndrome: Secondary | ICD-10-CM | POA: Diagnosis not present

## 2017-02-09 ENCOUNTER — Encounter: Payer: Self-pay | Admitting: Family Medicine

## 2017-03-02 ENCOUNTER — Ambulatory Visit (INDEPENDENT_AMBULATORY_CARE_PROVIDER_SITE_OTHER): Payer: BLUE CROSS/BLUE SHIELD | Admitting: Family Medicine

## 2017-03-02 ENCOUNTER — Other Ambulatory Visit: Payer: Self-pay

## 2017-03-02 ENCOUNTER — Encounter: Payer: Self-pay | Admitting: *Deleted

## 2017-03-02 ENCOUNTER — Encounter: Payer: Self-pay | Admitting: Family Medicine

## 2017-03-02 VITALS — BP 143/85 | HR 81 | Temp 98.0°F | Resp 16 | Ht 61.0 in | Wt 199.5 lb

## 2017-03-02 DIAGNOSIS — E7800 Pure hypercholesterolemia, unspecified: Secondary | ICD-10-CM | POA: Diagnosis not present

## 2017-03-02 DIAGNOSIS — F320 Major depressive disorder, single episode, mild: Secondary | ICD-10-CM | POA: Diagnosis not present

## 2017-03-02 DIAGNOSIS — J0100 Acute maxillary sinusitis, unspecified: Secondary | ICD-10-CM

## 2017-03-02 DIAGNOSIS — F411 Generalized anxiety disorder: Secondary | ICD-10-CM

## 2017-03-02 DIAGNOSIS — E119 Type 2 diabetes mellitus without complications: Secondary | ICD-10-CM | POA: Diagnosis not present

## 2017-03-02 MED ORDER — CITALOPRAM HYDROBROMIDE 20 MG PO TABS: 20.0000 mg | ORAL_TABLET | Freq: Every day | ORAL | 3 refills | Status: DC

## 2017-03-02 MED ORDER — ALBUTEROL SULFATE HFA 108 (90 BASE) MCG/ACT IN AERS: 1.0000 | INHALATION_SPRAY | Freq: Four times a day (QID) | RESPIRATORY_TRACT | 1 refills | Status: AC | PRN

## 2017-03-02 MED ORDER — ALPRAZOLAM 0.25 MG PO TABS: ORAL_TABLET | ORAL | 5 refills | Status: DC

## 2017-03-02 NOTE — Progress Notes (Signed)
OFFICE VISIT  03/02/2017   CC:  Chief Complaint  Patient presents with  . Follow-up    RCI    HPI:    Patient is a 56 y.o. Caucasian female who presents for f/u depression and GAD, using citalopram and alprazolam for this. She felt like her cymbalta was not helping her mood/anxiety much, although it was helping her fibromyalgia some.  She stopped this several months ago and we got her back on citalopram 20mg  qd but she is now out of this. Currently feels irritable, chronic worry, easily frustrated, angry.  No hypomanic behavior.  She has mild depressed mood, cries easily, feels overwhelmed easily.  Says she wants to go back on citalopram b/c she recalls that working better. Says she takes 0.25mg  alpraz only a few times a week--most recent rx is out of date and had a few RF's left on it.  She has also not had diabetic f/u in quite some time.  After most recent A1c and lipids were done 06/2016, I recommended she start metformin and statin. Insurance won't cover glucose monitoring. She did get on metformin after 06/2016, says she doesn't take it bid religiously, though.  She had some recent labs at work (July?) but doesn't recall which labs.  HLD: Taking atorv and not feeling any side effects.  She says her cholesterol levels were improved on labs at work this summer.    Past Medical History:  Diagnosis Date  . Chronic neck pain    DDD/cervical spondylosis--C3-C6.  Mild central canal stenosis.  . Chronic pain syndrome    meds managed/rx'd by Dr. Ethelene Halamos, then pt referred [facility] by Dr. Ethelene Halamos 01/2017.  . DDD (degenerative disc disease), lumbar    MRI 05/13/16: mild DDD at L3-4, and ruptured disc at L4-5 to the right.  Dr. Ethelene Halamos recommended right L5 selective nerve root block + referral to Dr. Darrelyn HillockGioffre for surgery (as of 06/04/16 f/u).  . Diabetes mellitus without complication (HCC) 01/04/2016   Dx'd with elevated random gluc + HbA1c 6.6%.  Marland Kitchen. GAD (generalized anxiety disorder)     with anger control problems  . GERD (gastroesophageal reflux disease)   . History of pneumonia 2010   with hemoptysis; Pulm eval done, f/u CXR 04/01/09 after pneumonia was normal.  . History of rheumatic fever    childhood--she reports no CV or Renal sequelae  . Hyperlipidemia 12/2015   started statin 12/2015  . IBS (irritable bowel syndrome)    Diarrhea predominant  . Impingement syndrome of right shoulder   . Polyarthralgia 2017   Rheum eval (Dr. Nickola MajorHawkes) 10/2015: no inflammatory arthritis suspected.  . Tobacco abuse    30+ pack-yr hx  . Venous insufficiency 01/2012    Past Surgical History:  Procedure Laterality Date  . CHOLECYSTECTOMY  2011  . COLONOSCOPY  approx 2007   Dr. Jarold MottoPatterson (normal per pt--cannot locate path or procedure record)  . ESOPHAGOGASTRODUODENOSCOPY  "   "   Mild esophagogastric junction inflammation, otherwise normal.  . OVARY SURGERY  1993   left (ovarian cyst)  . VAGINAL HYSTERECTOMY  2003   Nonmalignant reasons (DUB)    Outpatient Medications Prior to Visit  Medication Sig Dispense Refill  . aluminum chloride (DRYSOL) 20 % external solution Apply topically as needed.    Marland Kitchen. atorvastatin (LIPITOR) 20 MG tablet Take 1 tablet (20 mg total) by mouth daily. 90 tablet 3  . desloratadine (CLARINEX) 5 MG tablet Take 1 tablet (5 mg total) by mouth daily. 90  tablet 1  . fluticasone (FLONASE) 50 MCG/ACT nasal spray Place 2 sprays into both nostrils daily. 16 g 6  . glucose blood test strip Use as instructed 100 each 4  . HYDROcodone-acetaminophen (NORCO/VICODIN) 5-325 MG per tablet 1-2 tabs po q6h prn pain 30 tablet 0  . metFORMIN (GLUCOPHAGE) 500 MG tablet TAKE 1 TABLET (500 MG TOTAL) BY MOUTH 2 (TWO) TIMES DAILY WITH A MEAL. 60 tablet 3  . methocarbamol (ROBAXIN) 500 MG tablet Take 1 tablet (500 mg total) by mouth 2 (two) times daily. 20 tablet 0  . naproxen (NAPROSYN) 500 MG tablet Take 1 tablet (500 mg total) by mouth 2 (two) times daily with a meal. 30  tablet 0  . albuterol (PROAIR HFA) 108 (90 BASE) MCG/ACT inhaler Inhale 2 puffs into the lungs every 6 (six) hours as needed for wheezing. 1 Inhaler 0  . ALPRAZolam (XANAX) 0.25 MG tablet 1-2 tabs po bid prn anxiety 60 tablet 5  . citalopram (CELEXA) 20 MG tablet Take 1 tablet (20 mg total) by mouth daily. 1 tablet 0  . furosemide (LASIX) 20 MG tablet Take 1 tablet (20 mg total) by mouth daily as needed. 10 tablet 0   No facility-administered medications prior to visit.     No Known Allergies  ROS As per HPI  PE: Repeat bp 138/92 Blood pressure (!) 143/85, pulse 81, temperature 98 F (36.7 C), temperature source Oral, resp. rate 16, height 5\' 1"  (1.549 m), weight 199 lb 8 oz (90.5 kg), SpO2 99 %. Gen: Alert, well appearing.  Patient is oriented to person, place, time, and situation. AFFECT: pleasant, lucid thought and speech. CV: RRR, no m/r/g.   LUNGS: CTA bilat, nonlabored resps, good aeration in all lung fields.   LABS:    Chemistry      Component Value Date/Time   NA 139 06/23/2016 0821   K 4.4 06/23/2016 0821   CL 105 06/23/2016 0821   CO2 29 06/23/2016 0821   BUN 16 06/23/2016 0821   CREATININE 0.59 06/23/2016 0821   CREATININE 0.55 10/04/2012 1605      Component Value Date/Time   CALCIUM 9.1 06/23/2016 0821   ALKPHOS 76 01/01/2016 1437   AST 20 01/01/2016 1437   ALT 28 01/01/2016 1437   BILITOT 0.2 01/01/2016 1437     Lab Results  Component Value Date   HGBA1C 7.5 (H) 06/23/2016    IMPRESSION AND PLAN:  1) GAD plus mild MDD w/out psychotic features.   Restart citalopram 20mg  qd. She'll continue prn use of 0.25mg  alprazolam, which she uses quite sparingly. She signed CSC today.  She gets periodic UDS's through her pain mgmt MD (Dr. Ethelene Halamos), so we'll have them send results to us and won't duplicate this testing here. Alprazolam rx faxed today.  2) DM 2: taking metformin but not in full compliance. She wants to hold off on any A1c check today---says she  wants to bring me her labs from work done this summer. Also, will plan on repeating labs at next f/u in 3 mo.  3) Hypercholesterolemia: per pt report, much improved numbers on atorva. She is tolerating this med well.  Again, need to review her labs from work this summer.  Instructions: Fax of drop off the lab work you got done at your employer's this summer.  FOLLOW UP: Return in about 3 months (around 06/02/2017) for routine chronic illness f/u.  Signed:  Santiago BumpersPhil Shantele Reller, MD           03/02/2017

## 2017-03-02 NOTE — Patient Instructions (Signed)
Fax of drop off the lab work you got done at your employer's this summer.

## 2017-04-03 ENCOUNTER — Other Ambulatory Visit: Payer: Self-pay | Admitting: Family Medicine

## 2017-04-11 DIAGNOSIS — G894 Chronic pain syndrome: Secondary | ICD-10-CM | POA: Diagnosis not present

## 2017-04-11 DIAGNOSIS — G609 Hereditary and idiopathic neuropathy, unspecified: Secondary | ICD-10-CM | POA: Diagnosis not present

## 2017-04-11 DIAGNOSIS — F4542 Pain disorder with related psychological factors: Secondary | ICD-10-CM | POA: Diagnosis not present

## 2017-04-11 DIAGNOSIS — M792 Neuralgia and neuritis, unspecified: Secondary | ICD-10-CM | POA: Diagnosis not present

## 2017-04-19 DIAGNOSIS — G894 Chronic pain syndrome: Secondary | ICD-10-CM | POA: Diagnosis not present

## 2017-04-19 DIAGNOSIS — M47817 Spondylosis without myelopathy or radiculopathy, lumbosacral region: Secondary | ICD-10-CM | POA: Diagnosis not present

## 2017-04-19 DIAGNOSIS — M5136 Other intervertebral disc degeneration, lumbar region: Secondary | ICD-10-CM | POA: Diagnosis not present

## 2017-04-20 DIAGNOSIS — M5412 Radiculopathy, cervical region: Secondary | ICD-10-CM | POA: Diagnosis not present

## 2017-04-20 DIAGNOSIS — R200 Anesthesia of skin: Secondary | ICD-10-CM | POA: Diagnosis not present

## 2017-04-22 ENCOUNTER — Other Ambulatory Visit: Payer: Self-pay | Admitting: Physical Medicine and Rehabilitation

## 2017-04-22 DIAGNOSIS — M25562 Pain in left knee: Secondary | ICD-10-CM

## 2017-05-25 ENCOUNTER — Other Ambulatory Visit: Payer: Self-pay | Admitting: Family Medicine

## 2017-05-25 NOTE — Telephone Encounter (Signed)
[facility] and they said pt still has refills on file.   Pt advised and voiced understanding.

## 2017-05-25 NOTE — Telephone Encounter (Signed)
Copied from CRM [phone]. Topic: Quick Communication - Rx Refill/Question >> May 25, 2017  1:47 PM Oneal GroutSebastian, Jennifer S wrote: Medication: citalopram (CELEXA) 20 MG tablet    Has the patient contacted their pharmacy? Yes.     (Agent: If no, request that the patient contact the pharmacy for the refill.)   Preferred Pharmacy (with phone number or street name): [facility] [address]   Agent: Please be advised that RX refills may take up to 3 business days. We ask that you follow-up with your pharmacy.

## 2017-05-27 DIAGNOSIS — M25562 Pain in left knee: Secondary | ICD-10-CM | POA: Diagnosis not present

## 2017-05-27 DIAGNOSIS — Z79891 Long term (current) use of opiate analgesic: Secondary | ICD-10-CM | POA: Diagnosis not present

## 2017-05-27 DIAGNOSIS — G894 Chronic pain syndrome: Secondary | ICD-10-CM | POA: Diagnosis not present

## 2017-05-27 DIAGNOSIS — M545 Low back pain: Secondary | ICD-10-CM | POA: Diagnosis not present

## 2017-05-27 DIAGNOSIS — M542 Cervicalgia: Secondary | ICD-10-CM | POA: Diagnosis not present

## 2017-07-26 ENCOUNTER — Other Ambulatory Visit: Payer: Self-pay | Admitting: Family Medicine

## 2017-09-05 ENCOUNTER — Ambulatory Visit (INDEPENDENT_AMBULATORY_CARE_PROVIDER_SITE_OTHER): Payer: BLUE CROSS/BLUE SHIELD | Admitting: Family Medicine

## 2017-09-05 ENCOUNTER — Encounter: Payer: Self-pay | Admitting: Family Medicine

## 2017-09-05 VITALS — BP 146/83 | HR 76 | Temp 98.3°F | Resp 20 | Ht 61.0 in | Wt 205.0 lb

## 2017-09-05 DIAGNOSIS — I10 Essential (primary) hypertension: Secondary | ICD-10-CM

## 2017-09-05 DIAGNOSIS — M25562 Pain in left knee: Secondary | ICD-10-CM

## 2017-09-05 DIAGNOSIS — R600 Localized edema: Secondary | ICD-10-CM

## 2017-09-05 DIAGNOSIS — G8929 Other chronic pain: Secondary | ICD-10-CM | POA: Insufficient documentation

## 2017-09-05 HISTORY — DX: Essential (primary) hypertension: I10

## 2017-09-05 MED ORDER — HYDROCHLOROTHIAZIDE 25 MG PO TABS: 25.0000 mg | ORAL_TABLET | Freq: Every day | ORAL | 0 refills | Status: DC

## 2017-09-05 NOTE — Progress Notes (Signed)
[name] , 06-07-1960, 57 y.o., female MRN: [id number] Patient Care Team    Relationship Specialty Notifications Start End  McGowen, Maryjean Morn, MD PCP - General Family Medicine  08/02/11   Delton See, MD Consulting Physician Rehabilitation  09/12/13    Comment: [facility]  Sheran Luz, MD Consulting Physician Physical Medicine and Rehabilitation  10/31/13   Zenovia Jordan, MD Consulting Physician Rheumatology  11/05/15   Ardell Isaacs, MD Consulting Physician Pain Medicine  02/09/17     Chief Complaint  Patient presents with  . Knee Pain    chronic      Subjective: Pt presents for an OV with complaints of left knee and leg swelling of 4 weeks duration.  She reports symptoms started after her beach trip in which she was eating higher sodium. The swelling is worse at the end of the day, improved with elevation. She denies redness or fever. She states she has an appt with her orthopedic next week to discuss knee pain. She states she called there yesterday and was told to follow with her primary for swelling.  Pt has tried ice packs to ease their symptoms. She is on chronic narcotics for back pain. She reports her back pain meds do not "touch" her knee pain. She endorses knee popping, catching, snapping. She had an xray 4 months ago at ortho and they felt arthritis (per pt), but had enough concern to order an MRI. She has not been able to get MRi 2/2 to cost. She has an appt next week with ortho. She is taking aleve BID for pain.   Depression screen PHQ 2/9 03/02/2017  Decreased Interest 0  Down, Depressed, Hopeless 0  PHQ - 2 Score 0  Altered sleeping 0  Tired, decreased energy 1  Change in appetite 3  Feeling bad or failure about yourself  1  Trouble concentrating 0  Moving slowly or fidgety/restless 0  Suicidal thoughts 0  PHQ-9 Score 5  Difficult doing work/chores Not difficult at all    No Known Allergies Social History   Tobacco Use  . Smoking status:  Light Tobacco Smoker    Types: Cigarettes  . Smokeless tobacco: Never Used  Substance Use Topics  . Alcohol use: Yes    Comment: occ wine   Past Medical History:  Diagnosis Date  . Chronic neck pain    DDD/cervical spondylosis--C3-C6.  Mild central canal stenosis.  . Chronic pain syndrome    meds managed/rx'd by Dr. Ethelene Hal, then pt [facility] by Dr. Ethelene Hal 01/2017.  . DDD (degenerative disc disease), lumbar    MRI 05/13/16: mild DDD at L3-4, and ruptured disc at L4-5 to the right.  Dr. Ethelene Hal recommended right L5 selective nerve root block + referral to Dr. Darrelyn Hillock for surgery (as of 06/04/16 f/u).  . Diabetes mellitus without complication (HCC) 01/04/2016   Dx'd with elevated random gluc + HbA1c 6.6%.  Marland Kitchen GAD (generalized anxiety disorder)    with anger control problems  . GERD (gastroesophageal reflux disease)   . History of pneumonia 2010   with hemoptysis; Pulm eval done, f/u CXR 04/01/09 after pneumonia was normal.  . History of rheumatic fever    childhood--she reports no CV or Renal sequelae  . Hyperlipidemia 12/2015   started statin 12/2015  . IBS (irritable bowel syndrome)    Diarrhea predominant  . Impingement syndrome of right shoulder   . Polyarthralgia 2017   Rheum eval (Dr. Nickola Major) 10/2015: no inflammatory arthritis  suspected.  . Tobacco abuse    30+ pack-yr hx  . Venous insufficiency 01/2012   Past Surgical History:  Procedure Laterality Date  . CHOLECYSTECTOMY  2011  . COLONOSCOPY  approx 2007   Dr. Jarold Motto (normal per pt--cannot locate path or procedure record)  . ESOPHAGOGASTRODUODENOSCOPY  "   "   Mild esophagogastric junction inflammation, otherwise normal.  . OVARY SURGERY  1993   left (ovarian cyst)  . VAGINAL HYSTERECTOMY  2003   Nonmalignant reasons (DUB)   Family History  Problem Relation Age of Onset  . Alcohol abuse Father   . Heart disease Maternal Grandfather    Allergies as of 09/05/2017   No Known Allergies       Medication List        Accurate as of 09/05/17  3:48 PM. Always use your most recent med list.          albuterol 108 (90 Base) MCG/ACT inhaler Commonly known as:  PROAIR HFA Inhale 1-2 puffs every 6 (six) hours as needed into the lungs for wheezing.   ALPRAZolam 0.25 MG tablet Commonly known as:  XANAX 1-2 tabs po bid prn anxiety   aluminum chloride 20 % external solution Commonly known as:  DRYSOL Apply topically as needed.   atorvastatin 20 MG tablet Commonly known as:  LIPITOR TAKE 1 TABLET BY MOUTH DAILY   citalopram 20 MG tablet Commonly known as:  CELEXA Take 1 tablet (20 mg total) daily by mouth.   desloratadine 5 MG tablet Commonly known as:  CLARINEX Take 1 tablet (5 mg total) by mouth daily.   fluticasone 50 MCG/ACT nasal spray Commonly known as:  FLONASE PLACE 2 SPRAYS INTO BOTH NOSTRILS DAILY.   glucose blood test strip Use as instructed   metaxalone 800 MG tablet Commonly known as:  SKELAXIN metaxalone 800 mg tablet   metFORMIN 500 MG tablet Commonly known as:  GLUCOPHAGE TAKE 1 TABLET (500 MG TOTAL) BY MOUTH 2 (TWO) TIMES DAILY WITH A MEAL.   methocarbamol 500 MG tablet Commonly known as:  ROBAXIN Take 1 tablet (500 mg total) by mouth 2 (two) times daily.   naproxen 500 MG tablet Commonly known as:  NAPROSYN Take 1 tablet (500 mg total) by mouth 2 (two) times daily with a meal.   oxyCODONE-acetaminophen 10-325 MG tablet Commonly known as:  PERCOCET TAKE 1 TABLET BY MOUTH FOUR TIMES A DAY AS NEEDED       All past medical history, surgical history, allergies, family history, immunizations andmedications were updated in the EMR today and reviewed under the history and medication portions of their EMR.     ROS: Negative, with the exception of above mentioned in HPI   Objective:  BP (!) 146/83 (BP Location: Left Arm, Patient Position: Sitting, Cuff Size: Large)   Pulse 76   Temp 98.3 F (36.8 C)   Resp 20   Ht  (1.549 m)   Wt  205 lb (93 kg)   SpO2 97%   BMI 38.73 kg/m  Body mass index is 38.73 kg/m. Gen: Afebrile. No acute distress. Nontoxic in appearance, well developed, well nourished.  HENT: AT. [place].  MMM Eyes:Pupils Equal Round Reactive to light, Extraocular movements intact,  Conjunctiva without redness, discharge or icterus. CV: RRR no murmur, +1 bilateral lower ext edema Chest: CTAB, no wheeze or crackles.  MSK: no erythema, no knee soft tissue swelling. Small effusion left knee present. No ttp joint lines or bony prominence. Mild TTP posterior knee.  Negative lachmans, no ligament laxity. No creptius, ROM normal. NV intact distally. +1 edema bilateral LE. Neuro: mild limp present. PERLA. EOMi. Alert. Oriented x3  No exam data present No results found. No results found for this or any previous visit (from the past 24 hour(s)).  Assessment/Plan: [name] is a 57 y.o. female present for OV for  Bilateral lower extremity edema/left knee pain - she can continue NSAIDS for added pain coverage, may increase some edema though. Defer to Ortho for further pain management of knee. The pain of her knee and bilateral edema are not felt to be related and she is on pain medications currently.  - rest and elevate, compression - encouraged her to have MRI of her knee (ortho ordered) to further eval knee.  - Compression stockings script supplied.  - elevation - low sodium Essential hypertension - some of the bilateral edema may be secondary to increase in sodium and her beach trip, however she has had documentation of bilateral edema for many years. Her BP has been mildly elevated the last 1.5 years. - Suggested start of HCTZ that will help with BP and her bilateral edema, which could give her more comfort. She is agreeable to this today  - HCTZ 25 mg QD start, f/u PCP in 1 month for recheck, sooner if edema worsening.    Reviewed expectations re: course of current medical issues.  Discussed  self-management of symptoms.  Outlined signs and symptoms indicating need for more acute intervention.  Patient verbalized understanding and all questions were answered.  Patient received an After-Visit Summary.    No orders of the defined types were placed in this encounter.    Note is dictated utilizing voice recognition software. Although note has been proof read prior to signing, occasional typographical errors still can be missed. If any questions arise, please do not hesitate to call for verification.   electronically signed by:  Felix Pacini, DO  [facility] - [place]

## 2017-09-05 NOTE — Patient Instructions (Addendum)
Low sodium, plenty of water.  Compression stockings, elevation is key.  HCTZ for elevated BP and it will help with swelling.     Chronic Venous Insufficiency Chronic venous insufficiency, also called venous stasis, is a condition that prevents blood from being pumped effectively through the veins in your legs. Blood may no longer be pumped effectively from the legs back to the heart. This condition can range from mild to severe. With proper treatment, you should be able to continue with an active life. What are the causes? Chronic venous insufficiency occurs when the vein walls become stretched, weakened, or damaged, or when valves within the vein are damaged. Some common causes of this include:  High blood pressure inside the veins (venous hypertension).  Increased blood pressure in the leg veins from long periods of sitting or standing.  A blood clot that blocks blood flow in a vein (deep vein thrombosis, DVT).  Inflammation of a vein (phlebitis) that causes a blood clot to form.  Tumors in the pelvis that cause blood to back up.  What increases the risk? The following factors may make you more likely to develop this condition:  Having a family history of this condition.  Obesity.  Pregnancy.  Living without enough physical activity or exercise (sedentary lifestyle).  Smoking.  Having a job that requires long periods of standing or sitting in one place.  Being a certain age. Women in their 6s and 32s and men in their 69s are more likely to develop this condition.  What are the signs or symptoms? Symptoms of this condition include:  Veins that are enlarged, bulging, or twisted (varicose veins).  Skin breakdown or ulcers.  Reddened or discolored skin on the front of the leg.  Brown, smooth, tight, and painful skin just above the ankle, usually on the inside of the leg (lipodermatosclerosis).  Swelling.  How is this diagnosed? This condition may be diagnosed based  on:  Your medical history.  A physical exam.  Tests, such as: ? A procedure that creates an image of a blood vessel and nearby organs and provides information about blood flow through the blood vessel (duplex ultrasound). ? A procedure that tests blood flow (plethysmography). ? A procedure to look at the veins using X-ray and dye (venogram).  How is this treated? The goals of treatment are to help you return to an active life and to minimize pain or disability. Treatment depends on the severity of your condition, and it may include:  Wearing compression stockings. These can help relieve symptoms and help prevent your condition from getting worse. However, they do not cure the condition.  Sclerotherapy. This is a procedure involving an injection of a material that "dissolves" damaged veins.  Surgery. This may involve: ? Removing a diseased vein (vein stripping). ? Cutting off blood flow through the vein (laser ablation surgery). ? Repairing a valve.  Follow these instructions at home:  Wear compression stockings as told by your health care provider. These stockings help to prevent blood clots and reduce swelling in your legs.  Take over-the-counter and prescription medicines only as told by your health care provider.  Stay active by exercising, walking, or doing different activities. Ask your health care provider what activities are safe for you and how much exercise you need.  Drink enough fluid to keep your urine clear or pale yellow.  Do not use any products that contain nicotine or tobacco, such as cigarettes and e-cigarettes. If you need help quitting, ask your  health care provider.  Keep all follow-up visits as told by your health care provider. This is important. Contact a health care provider if:  You have redness, swelling, or more pain in the affected area.  You see a red streak or line that extends up or down from the affected area.  You have skin breakdown or a  loss of skin in the affected area, even if the breakdown is small.  You get an injury in the affected area. Get help right away if:  You get an injury and an open wound in the affected area.  You have severe pain that does not get better with medicine.  You have sudden numbness or weakness in the foot or ankle below the affected area, or you have trouble moving your foot or ankle.  You have a fever and you have worse or persistent symptoms.  You have chest pain.  You have shortness of breath. Summary  Chronic venous insufficiency, also called venous stasis, is a condition that prevents blood from being pumped effectively through the veins in your legs.  Chronic venous insufficiency occurs when the vein walls become stretched, weakened, or damaged, or when valves within the vein are damaged.  Treatment for this condition depends on how severe your condition is, and it may involve wearing compression stockings or having a procedure.  Make sure you stay active by exercising, walking, or doing different activities. Ask your health care provider what activities are safe for you and how much exercise you need. This information is not intended to replace advice given to you by your health care provider. Make sure you discuss any questions you have with your health care provider. Document Released: 08/15/2006 Document Revised: 02/29/2016 Document Reviewed: 02/29/2016 Elsevier Interactive Patient Education  2017 [employer].

## 2017-09-11 DIAGNOSIS — M25562 Pain in left knee: Secondary | ICD-10-CM | POA: Diagnosis not present

## 2017-09-13 ENCOUNTER — Other Ambulatory Visit: Payer: Self-pay | Admitting: *Deleted

## 2017-09-16 DIAGNOSIS — M25562 Pain in left knee: Secondary | ICD-10-CM | POA: Diagnosis not present

## 2017-09-19 ENCOUNTER — Encounter: Payer: Self-pay | Admitting: Family Medicine

## 2017-09-26 IMAGING — MR MR LUMBAR SPINE W/O CM
5 series · 45 of 48 positions shown · non-contrast
Comparison: Lumbar radiographs 04/26/2016

CLINICAL DATA: Lumbar disc degeneration. Back pain with right hip
pain

EXAM:
MRI LUMBAR SPINE WITHOUT CONTRAST
TECHNIQUE: Multiplanar, multisequence MR imaging of the lumbar spine was
performed. No intravenous contrast was administered.

[Series 3: T2 · sagittal · 4.0mm · 0.94mm/px · 6 of 13 slices shown (1 of 2)]
[im 1/13]
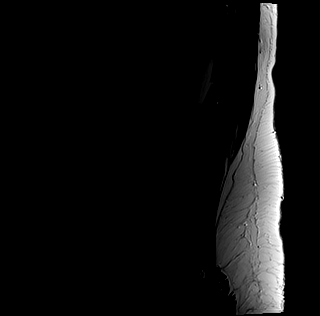
[im 3/13]
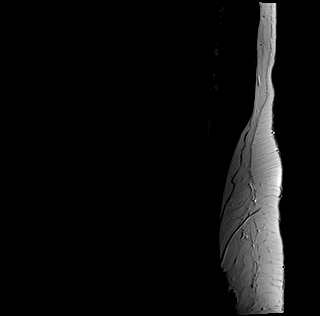
[im 5/13]
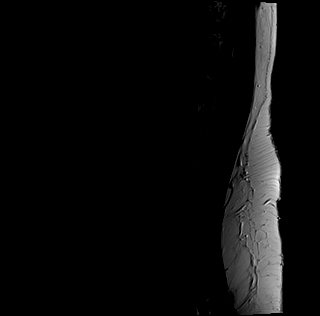
[im 8/13]
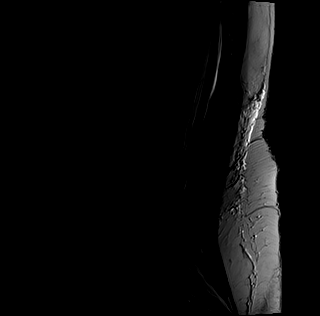
[im 10/13]
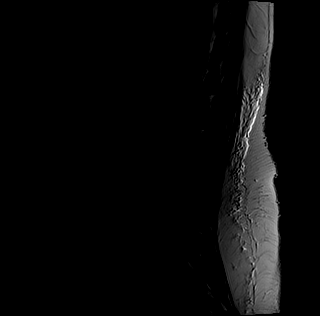
[im 13/13]
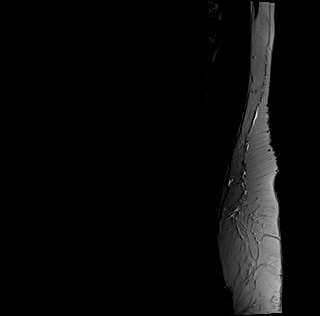

[Series 4: T1 · sagittal · 4.0mm · 0.94mm/px · 6 of 13 slices shown (1 of 2)]
[im 1/13]
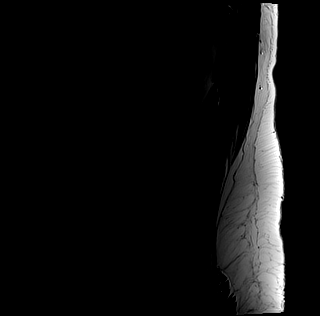
[im 3/13]
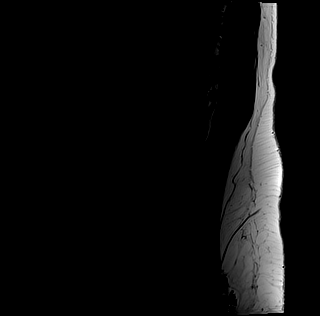
[im 5/13]
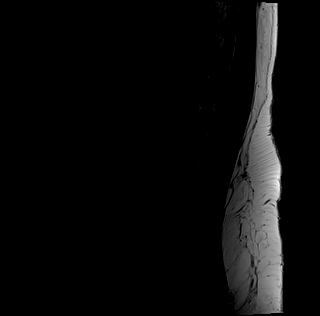
[im 8/13]
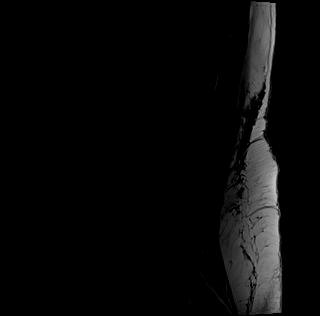
[im 10/13]
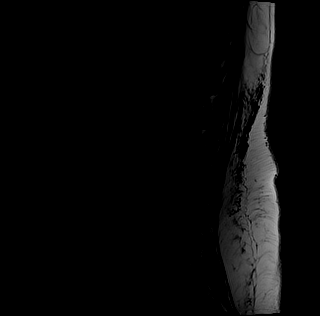
[im 13/13]
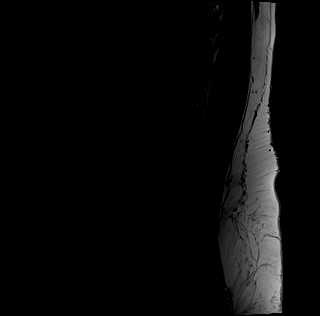

[Series 5: tirm sag · sagittal · 4.0mm · 0.59mm/px · 6 of 13 slices shown]
[im 1/13]
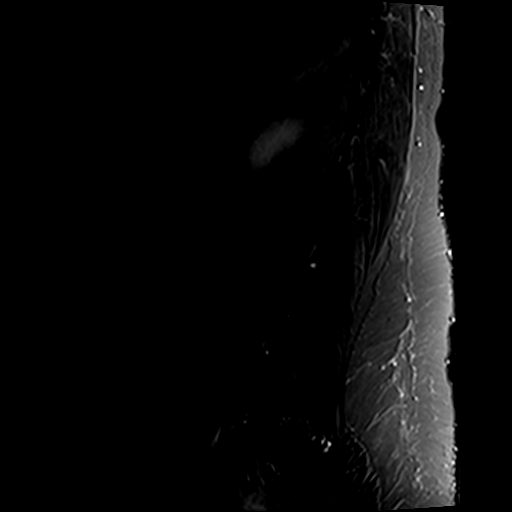
[im 3/13]
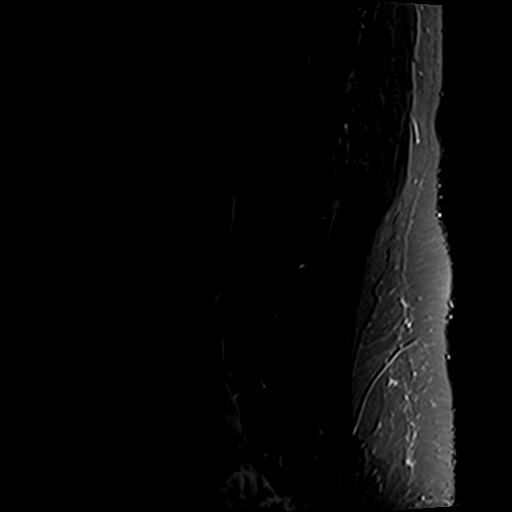
[im 5/13]
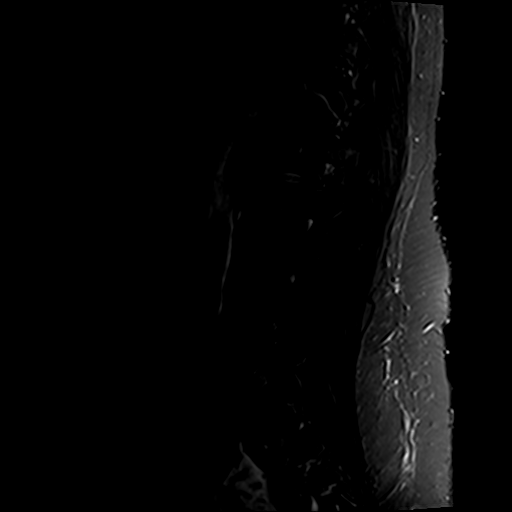
[im 8/13]
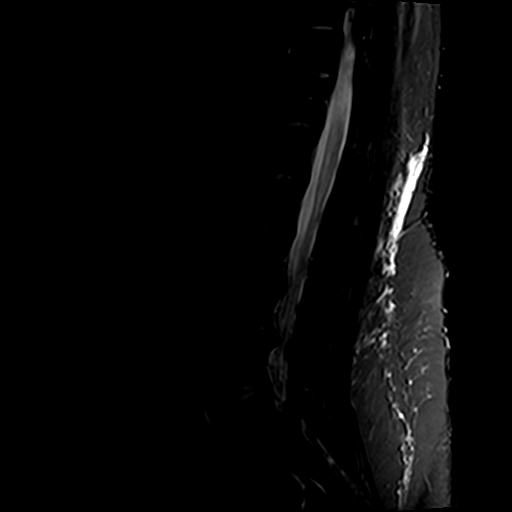
[im 10/13]
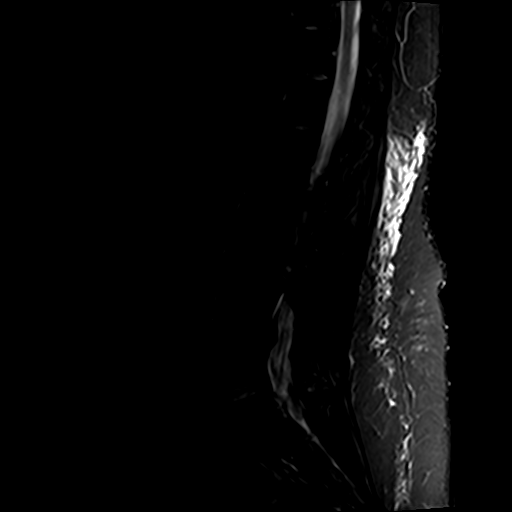
[im 13/13]
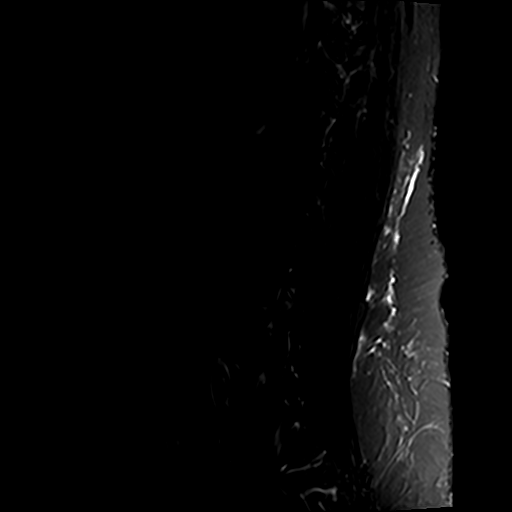

[Series 6: T1 · axial · 4.0mm · 0.78mm/px · z∈[-96,+75]mm · 12 of 31 slices shown (2 of 2)]
[im 1/31]
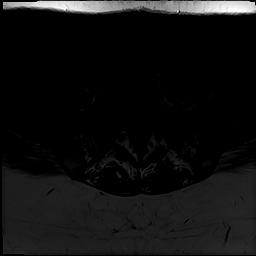
[im 3/31]
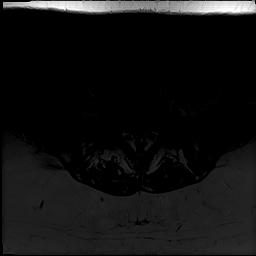
[im 5/31]
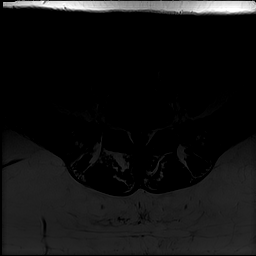
[im 7/31]
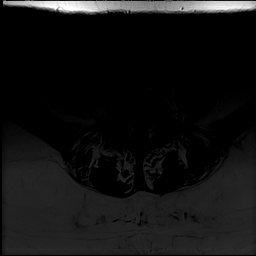
[im 9/31]
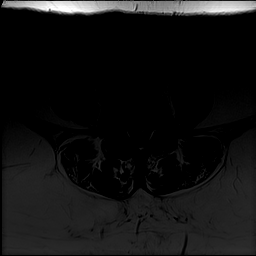
[im 11/31]
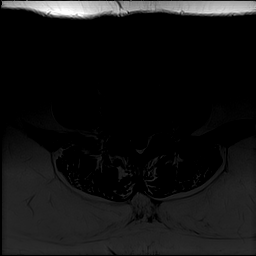
[im 13/31]
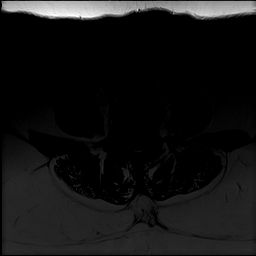
[im 16/31]
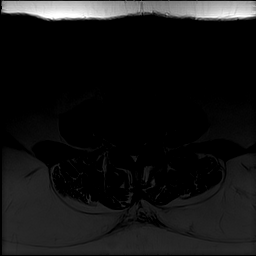
[im 18/31]
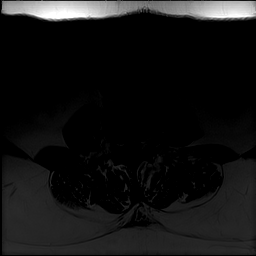
[im 22/31]
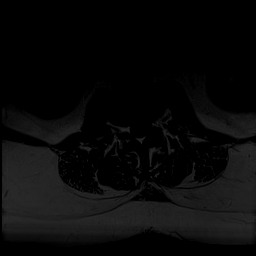
[im 26/31]
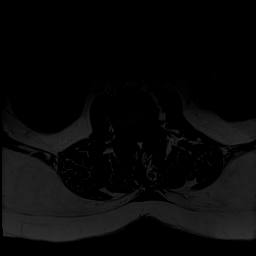
[im 31/31]
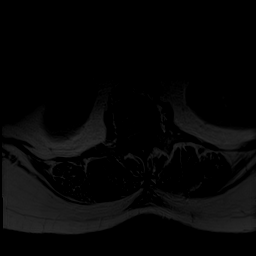

[Series 7: T2 · axial · 4.0mm · 0.78mm/px · z∈[-96,+75]mm · 15 of 31 slices shown (2 of 2)]
[im 1/31]
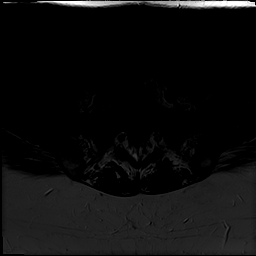
[im 3/31]
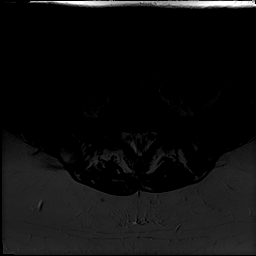
[im 5/31]
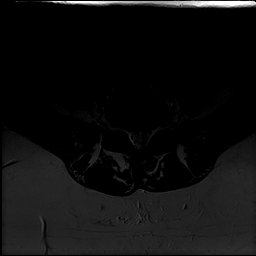
[im 7/31]
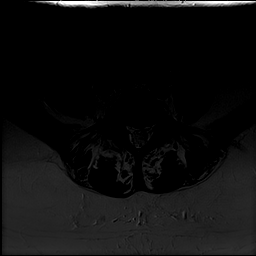
[im 9/31]
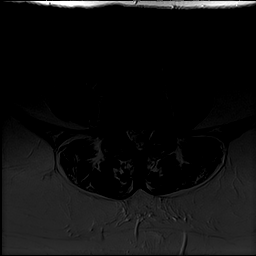
[im 11/31]
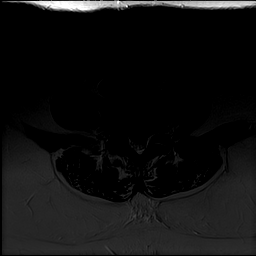
[im 13/31]
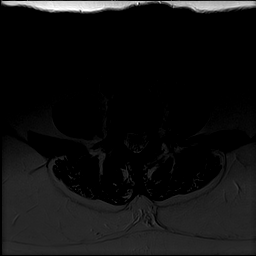
[im 16/31]
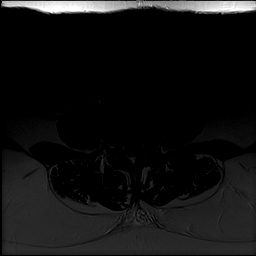
[im 18/31]
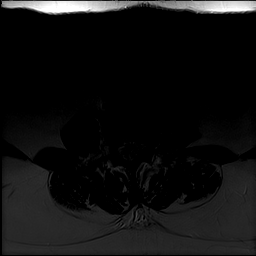
[im 20/31]
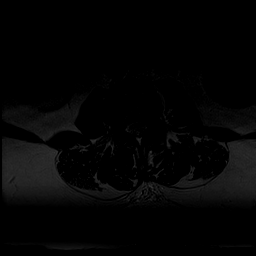
[im 22/31]
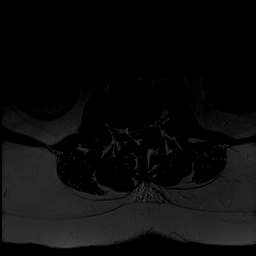
[im 24/31]
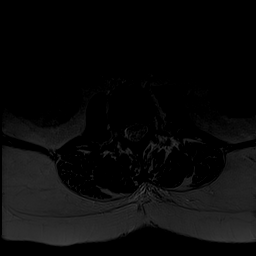
[im 26/31]
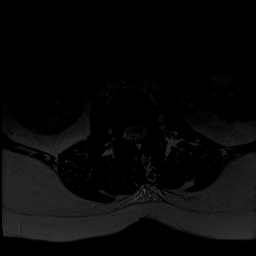
[im 28/31]
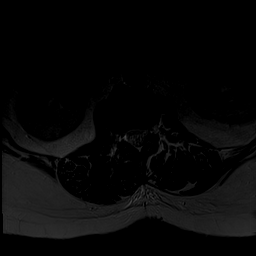
[im 31/31]
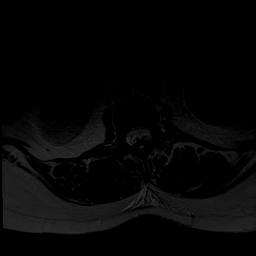

[45 of 48 positions shown; findings below may reference images not displayed]

FINDINGS: Segmentation:  Normal

Alignment:  Normal

Vertebrae:  Negative for fracture or mass.

Conus medullaris: Extends to the L1-2 level and appears normal.

Paraspinal and other soft tissues: Paraspinous muscles symmetric and
normal. No retroperitoneal abnormality.

Disc levels:

L1-2:  Negative

L2-3:  Negative

L3-4: Diffuse disc bulging with endplate spurring. Mild facet
degeneration. Mild spinal stenosis and mild foraminal stenosis
bilaterally.

L4-5: Diffuse bulging of the disc with endplate spurring. Small
right paracentral disc protrusion with subarticular stenosis on the
right. Moderate right foraminal encroachment. Possible impingement
of the right L4 and L5 nerve roots. Mild spinal stenosis. Mild left
foraminal narrowing

L5-S1:  Negative
IMPRESSION: Mild spinal stenosis and mild foraminal stenosis bilaterally L3-4

Disc bulging L4-5 with mild spinal stenosis. Small right-sided disc
protrusion causing subarticular and foraminal stenosis on the right.

## 2017-10-04 ENCOUNTER — Ambulatory Visit (INDEPENDENT_AMBULATORY_CARE_PROVIDER_SITE_OTHER): Payer: BLUE CROSS/BLUE SHIELD | Admitting: Family Medicine

## 2017-10-04 ENCOUNTER — Encounter: Payer: Self-pay | Admitting: Family Medicine

## 2017-10-04 VITALS — BP 149/112 | HR 73 | Temp 97.7°F | Resp 16 | Ht 61.0 in | Wt 204.5 lb

## 2017-10-04 DIAGNOSIS — I10 Essential (primary) hypertension: Secondary | ICD-10-CM | POA: Diagnosis not present

## 2017-10-04 DIAGNOSIS — E119 Type 2 diabetes mellitus without complications: Secondary | ICD-10-CM

## 2017-10-04 DIAGNOSIS — E7800 Pure hypercholesterolemia, unspecified: Secondary | ICD-10-CM

## 2017-10-04 DIAGNOSIS — E669 Obesity, unspecified: Secondary | ICD-10-CM

## 2017-10-04 MED ORDER — ATORVASTATIN CALCIUM 20 MG PO TABS: ORAL_TABLET | ORAL | 1 refills | Status: DC

## 2017-10-04 MED ORDER — LISINOPRIL 10 MG PO TABS: 10.0000 mg | ORAL_TABLET | Freq: Every day | ORAL | 1 refills | Status: DC

## 2017-10-04 NOTE — Progress Notes (Signed)
OFFICE VISIT  10/04/2017   CC:  Chief Complaint  Patient presents with  . Follow-up    HTN and new medications   HPI:    Patient is a 57 y.o. Caucasian female who presents for 1 mo f/u HTN, started HCTZ 25mg  last visit when she saw Dr. Claiborne BillingsKuneff for kne pain.  She has some chronic bilat LL edema that hctz was hoped to help with as well, +compression stockings rx given. Regarding her knee pain at that time: she was to see ortho and get an MRI. This showed a torn meniscus.  She is in a lot of pain currently.  Legs still with edema.  She elevates legs and wears compression hose at night.  She is also due for f/u DM and HLD.  HTN: no home bp monitoring.  No side effects, no increase in urination is noted. She says she is a "saltaholic".  DM 2: A1c 7.5% last f/u 3 mo ago, started pt on metformin.  Compliance with metformin is <50%--forgetting, no side effects. No home glucose monitoring anymore.  HLD: lipids elevated 06/2017, advised increase of atorv to 40mg  qd.  She has been doing this dose by taking two 20mg  tabs qd.  No side effects.   Past Medical History:  Diagnosis Date  . Chronic neck pain    DDD/cervical spondylosis--C3-C6.  Mild central canal stenosis.  . Chronic pain syndrome    meds managed/rx'd by Dr. Ethelene Halamos, then [facility] by Dr. Ethelene Halamos 01/2017.  . DDD (degenerative disc disease), lumbar    MRI 05/13/16: mild DDD at L3-4, and ruptured disc at L4-5 to the right.  Dr. Ethelene Halamos recommended right L5 selective nerve root block + referral to Dr. Darrelyn HillockGioffre for surgery (as of 06/04/16 f/u).  . Diabetes mellitus without complication (HCC) 01/04/2016   Dx'd with elevated random gluc + HbA1c 6.6%.  . Essential hypertension 09/05/2017   Started HCTZ 25mg  qd 09/05/17  . GAD (generalized anxiety disorder)    with anger control problems  . GERD (gastroesophageal reflux disease)   . History of pneumonia 2010   with hemoptysis; Pulm eval done, f/u CXR 04/01/09 after  pneumonia was normal.  . History of rheumatic fever    childhood--she reports no CV or Renal sequelae  . Hyperlipidemia 12/2015   started statin 12/2015  . IBS (irritable bowel syndrome)    Diarrhea predominant  . Impingement syndrome of right shoulder   . Pain in left knee    [facility] to do MRI as of 09/13/17  . Polyarthralgia 2017   Rheum eval (Dr. Nickola MajorHawkes) 10/2015: no inflammatory arthritis suspected.  . Tobacco abuse    30+ pack-yr hx  . Venous insufficiency 01/2012    Past Surgical History:  Procedure Laterality Date  . CHOLECYSTECTOMY  2011  . COLONOSCOPY  approx 2007   Dr. Jarold MottoPatterson (normal per pt--cannot locate path or procedure record)  . ESOPHAGOGASTRODUODENOSCOPY  "   "   Mild esophagogastric junction inflammation, otherwise normal.  . OVARY SURGERY  1993   left (ovarian cyst)  . VAGINAL HYSTERECTOMY  2003   Nonmalignant reasons (DUB)    Outpatient Medications Prior to Visit  Medication Sig Dispense Refill  . albuterol (PROAIR HFA) 108 (90 Base) MCG/ACT inhaler Inhale 1-2 puffs every 6 (six) hours as needed into the lungs for wheezing. 1 Inhaler 1  . ALPRAZolam (XANAX) 0.25 MG tablet 1-2 tabs po bid prn anxiety 60 tablet 5  . aluminum chloride (DRYSOL) 20 % external  solution Apply topically as needed.    Marland Kitchen atorvastatin (LIPITOR) 20 MG tablet TAKE 1 TABLET BY MOUTH DAILY 90 tablet 1  . citalopram (CELEXA) 20 MG tablet Take 1 tablet (20 mg total) daily by mouth. 30 tablet 3  . desloratadine (CLARINEX) 5 MG tablet Take 1 tablet (5 mg total) by mouth daily. 90 tablet 1  . fluticasone (FLONASE) 50 MCG/ACT nasal spray PLACE 2 SPRAYS INTO BOTH NOSTRILS DAILY. 16 g 5  . glucose blood test strip Use as instructed 100 each 4  . hydrochlorothiazide (HYDRODIURIL) 25 MG tablet Take 1 tablet (25 mg total) by mouth daily. 90 tablet 0  . metaxalone (SKELAXIN) 800 MG tablet metaxalone 800 mg tablet    . metFORMIN (GLUCOPHAGE) 500 MG tablet TAKE 1 TABLET (500 MG TOTAL) BY MOUTH 2  (TWO) TIMES DAILY WITH A MEAL. 60 tablet 3  . methocarbamol (ROBAXIN) 500 MG tablet Take 1 tablet (500 mg total) by mouth 2 (two) times daily. 20 tablet 0  . naproxen (NAPROSYN) 500 MG tablet Take 1 tablet (500 mg total) by mouth 2 (two) times daily with a meal. 30 tablet 0  . oxyCODONE-acetaminophen (PERCOCET) 10-325 MG tablet TAKE 1 TABLET BY MOUTH FOUR TIMES A DAY AS NEEDED  0   No facility-administered medications prior to visit.     No Known Allergies  ROS As per HPI  PE: Blood pressure (!) 149/112, pulse 73, temperature 97.7 F (36.5 C), temperature source Oral, resp. rate 16, height 5\' 1"  (1.549 m), weight 204 lb 8 oz (92.8 kg), SpO2 100 %. Gen: Alert, well appearing.  Patient is oriented to person, place, time, and situation. AFFECT: pleasant, lucid thought and speech. CV: RRR, no m/r/g.   LUNGS: CTA bilat, nonlabored resps, good aeration in all lung fields. EXT: no clubbing or cyanosis.  She has 2+ pitting edema in R LL.  1+ pitting edema in L LL.  LABS:   Lab Results  Component Value Date   HGBA1C 7.5 (H) 06/23/2016      Chemistry      Component Value Date/Time   NA 139 06/23/2016 0821   K 4.4 06/23/2016 0821   CL 105 06/23/2016 0821   CO2 29 06/23/2016 0821   BUN 16 06/23/2016 0821   CREATININE 0.59 06/23/2016 0821   CREATININE 0.55 10/04/2012 1605      Component Value Date/Time   CALCIUM 9.1 06/23/2016 0821   ALKPHOS 76 01/01/2016 1437   AST 20 01/01/2016 1437   ALT 28 01/01/2016 1437   BILITOT 0.2 01/01/2016 1437       IMPRESSION AND PLAN:  1) DM 2: relatively noncompliant with metformin. Return for HbA1c, urine mircroalb/cr. Lytes/cr as well. Encouraged better compliance with diabetic diet and metformin.  2) HLD: tolerating atorva statin 40mg  qd.  Future fasting FLP.  3) HTN: not well controlled.. Continue hctz 25mg  qd and add lisinopril 10mg  qd.  An After Visit Summary was printed and given to the patient.  FOLLOW UP:  4 weeks f/u  HTN  Signed:  Santiago Bumpers, MD           10/10/2017

## 2017-10-09 ENCOUNTER — Other Ambulatory Visit: Payer: Self-pay | Admitting: Family Medicine

## 2017-10-09 ENCOUNTER — Encounter: Payer: Self-pay | Admitting: Family Medicine

## 2017-10-09 NOTE — Telephone Encounter (Signed)
Pt sent mychart message requesting refills for metformin and citalopram.   Note from 10/04/17 incomplete. Please advise. Thanks.   MyChart Message: I need my Metformin to be filled at 90 day supply or my insurance company will not pay also I have been out of my citalopram for a month now ( I really need to get my emotions under control quick) my chart says I have refills left but, nobody has the prescription. Can you see who has the refills? If not can you send me a 90 day supply for that also. [facility] [address]

## 2017-10-10 MED ORDER — METFORMIN HCL 500 MG PO TABS: 500.0000 mg | ORAL_TABLET | Freq: Two times a day (BID) | ORAL | 1 refills | Status: DC

## 2017-10-10 MED ORDER — CITALOPRAM HYDROBROMIDE 20 MG PO TABS: 20.0000 mg | ORAL_TABLET | Freq: Every day | ORAL | 1 refills | Status: DC

## 2017-10-10 NOTE — Telephone Encounter (Signed)
SW Dr. Milinda CaveMcGowen okay to refill these medication.   Rx's sent and mychart message sent to pt.

## 2017-10-12 ENCOUNTER — Encounter: Payer: Self-pay | Admitting: Family Medicine

## 2017-10-12 DIAGNOSIS — Z7689 Persons encountering health services in other specified circumstances: Secondary | ICD-10-CM

## 2017-10-12 DIAGNOSIS — E669 Obesity, unspecified: Secondary | ICD-10-CM

## 2017-10-12 NOTE — Telephone Encounter (Signed)
Please advise. Thanks.  

## 2017-10-12 NOTE — Telephone Encounter (Signed)
I won't rx weight loss medications. I can refer her to a wt loss clinic that can help her with these meds and with a general wt loss plan. Let me know if she wants this referral.

## 2017-10-13 NOTE — Telephone Encounter (Signed)
Pt agreed to referral.   Per Dr. Milinda CaveMcGowen okay to order [facility] Dr[facility] Dalbert GarnetBeasley.    Referral ordered.

## 2017-10-24 DIAGNOSIS — M25562 Pain in left knee: Secondary | ICD-10-CM | POA: Diagnosis not present

## 2017-10-28 ENCOUNTER — Other Ambulatory Visit: Payer: Self-pay | Admitting: Family Medicine

## 2017-11-10 ENCOUNTER — Ambulatory Visit: Payer: BLUE CROSS/BLUE SHIELD | Admitting: Family Medicine

## 2017-11-13 ENCOUNTER — Encounter: Payer: Self-pay | Admitting: Family Medicine

## 2017-12-05 ENCOUNTER — Other Ambulatory Visit: Payer: Self-pay

## 2017-12-05 MED ORDER — HYDROCHLOROTHIAZIDE 25 MG PO TABS: 25.0000 mg | ORAL_TABLET | Freq: Every day | ORAL | 0 refills | Status: DC

## 2017-12-14 ENCOUNTER — Other Ambulatory Visit: Payer: Self-pay | Admitting: Family Medicine

## 2017-12-19 NOTE — Telephone Encounter (Signed)
Tried calling, NA and unable to leave a vm due to vm box being full.

## 2017-12-27 NOTE — Telephone Encounter (Signed)
Letter mailed to pt.  

## 2017-12-27 NOTE — Telephone Encounter (Signed)
Tried calling, NA and unable to leave a vm due to vm box being full

## 2017-12-28 ENCOUNTER — Other Ambulatory Visit: Payer: Self-pay | Admitting: Family Medicine

## 2018-01-01 ENCOUNTER — Other Ambulatory Visit: Payer: Self-pay | Admitting: Family Medicine

## 2018-01-03 ENCOUNTER — Other Ambulatory Visit: Payer: Self-pay | Admitting: Family Medicine

## 2018-01-08 ENCOUNTER — Telehealth: Payer: Self-pay | Admitting: Family Medicine

## 2018-01-08 ENCOUNTER — Encounter: Payer: Self-pay | Admitting: Family Medicine

## 2018-01-08 ENCOUNTER — Ambulatory Visit (INDEPENDENT_AMBULATORY_CARE_PROVIDER_SITE_OTHER): Payer: BLUE CROSS/BLUE SHIELD | Admitting: Family Medicine

## 2018-01-08 VITALS — BP 130/77 | HR 64 | Temp 97.9°F | Resp 16 | Ht 61.0 in | Wt 198.0 lb

## 2018-01-08 DIAGNOSIS — E7800 Pure hypercholesterolemia, unspecified: Secondary | ICD-10-CM | POA: Diagnosis not present

## 2018-01-08 DIAGNOSIS — I10 Essential (primary) hypertension: Secondary | ICD-10-CM

## 2018-01-08 DIAGNOSIS — F411 Generalized anxiety disorder: Secondary | ICD-10-CM

## 2018-01-08 DIAGNOSIS — Z23 Encounter for immunization: Secondary | ICD-10-CM

## 2018-01-08 DIAGNOSIS — E119 Type 2 diabetes mellitus without complications: Secondary | ICD-10-CM

## 2018-01-08 MED ORDER — HYDROCHLOROTHIAZIDE 25 MG PO TABS: 25.0000 mg | ORAL_TABLET | Freq: Every day | ORAL | 1 refills | Status: DC

## 2018-01-08 MED ORDER — CITALOPRAM HYDROBROMIDE 20 MG PO TABS: 20.0000 mg | ORAL_TABLET | Freq: Every day | ORAL | 1 refills | Status: DC

## 2018-01-08 MED ORDER — FLUTICASONE PROPIONATE 50 MCG/ACT NA SUSP: 2.0000 | Freq: Every day | NASAL | 1 refills | Status: DC

## 2018-01-08 MED ORDER — ATORVASTATIN CALCIUM 40 MG PO TABS: 40.0000 mg | ORAL_TABLET | Freq: Every day | ORAL | 1 refills | Status: DC

## 2018-01-08 MED ORDER — ALPRAZOLAM 0.25 MG PO TABS: ORAL_TABLET | ORAL | 1 refills | Status: DC

## 2018-01-08 MED ORDER — METFORMIN HCL 500 MG PO TABS: 500.0000 mg | ORAL_TABLET | Freq: Two times a day (BID) | ORAL | 1 refills | Status: DC

## 2018-01-08 MED ORDER — LISINOPRIL 10 MG PO TABS: 10.0000 mg | ORAL_TABLET | Freq: Every day | ORAL | 1 refills | Status: DC

## 2018-01-08 NOTE — Telephone Encounter (Signed)
Copied from CRM [phone]. Topic: Quick Communication - Rx Refill/Question >> Jan 08, 2018  4:54 PM Floria RavelingStovall, Shana A wrote: Medication: desloratadine (CLARINEX) 5 MG tablet [[id number]][[id number]]   Has the patient contacted their pharmacy? No  (Agent: If no, request that the patient contact the pharmacy for the refill.) (Agent: If yes, when and what did the pharmacy advise?)  [facility]erred Pharm[facility] (with phone number [facility] street [facility] [address]   Agent: Please be advised that RX refills may take up to 3 business days. We ask that you follow-up with your pharmacy.

## 2018-01-08 NOTE — Progress Notes (Signed)
OFFICE VISIT  01/08/2018   CC:  Chief Complaint  Patient presents with  . Follow-up    HTN   HPI:    Patient is a 57 y.o. Caucasian female who presents for f/u DM 2, HTN, HLD. Feeling fine, just tired and overworked as usual.   DM: no home glucose checks.  Compliant with metformin 500 only about 50% of the time.  She doesn't like taking it b/c she has upset stomach/loose stool unless she takes it with food-->she finds taking it with food difficult due to her eating schedule. No burning, tingling, or numbness in feet.  HTN: no home bp machine.  Had one for a few days and it showed normal bp consistently.  HLD: tolerating atorva qd.   She had her biometric labs done in July through her employer.   She'll drop off these results. She doesn't recall her HbA1c. "nothing was real bad".  Of note, she has a posterior meniscus tear and a baker's cyst--found on MRI done by ortho surgeon. She is currently dealing with the pain and has no plans for intervention at this time.  Alprazolam: takes this a few times a week, 1-2 tabs at a time and only once on these days.  Past Medical History:  Diagnosis Date  . Chronic neck pain    DDD/cervical spondylosis--C3-C6.  Mild central canal stenosis.  . Chronic pain syndrome    meds managed/rx'd by Dr. Ethelene Halamos, then [facility]. Ethelene Halamos 01/2017.  . DDD (degenerative disc disease), lumbar    MRI 05/13/16: mild DDD at L3-4, and ruptured disc at L4-5 to the right.  Dr. Ethelene Halamos recommended right L5 selective nerve root block + referral to Dr. Darrelyn HillockGioffre for surgery (as of 06/04/16 f/u).  . Diabetes mellitus without complication (HCC) 01/04/2016   Dx'd with elevated random gluc + HbA1c 6.6%.  . Essential hypertension 09/05/2017   Started HCTZ 25mg  qd 09/05/17  . GAD (generalized anxiety disorder)    with anger control problems  . GERD (gastroesophageal reflux disease)   . History of pneumonia 2010   with hemoptysis; Pulm eval done,  f/u CXR 04/01/09 after pneumonia was normal.  . History of rheumatic fever    childhood--she reports no CV or Renal sequelae  . Hyperlipidemia 12/2015   started statin 12/2015  . IBS (irritable bowel syndrome)    Diarrhea predominant  . Impingement syndrome of right shoulder   . Pain in left knee    [facility]; MRI 09/2017--posterior horn medial meniscus tear (radial, not longitudinal).  Conservative rx as of 10/24/17 ortho f/u visit.  . Polyarthralgia 2017   Rheum eval (Dr. Nickola MajorHawkes) 10/2015: no inflammatory arthritis suspected.  . Tobacco abuse    30+ pack-yr hx  . Venous insufficiency 01/2012    Past Surgical History:  Procedure Laterality Date  . CHOLECYSTECTOMY  2011  . COLONOSCOPY  approx 2007   Dr. Jarold MottoPatterson (normal per pt--cannot locate path or procedure record)  . ESOPHAGOGASTRODUODENOSCOPY  "   "   Mild esophagogastric junction inflammation, otherwise normal.  . OVARY SURGERY  1993   left (ovarian cyst)  . VAGINAL HYSTERECTOMY  2003   Nonmalignant reasons (DUB)    Outpatient Medications Prior to Visit  Medication Sig Dispense Refill  . albuterol (PROAIR HFA) 108 (90 Base) MCG/ACT inhaler Inhale 1-2 puffs every 6 (six) hours as needed into the lungs for wheezing. 1 Inhaler 1  . aluminum chloride (DRYSOL) 20 % external solution Apply topically as needed.    .Marland Kitchen  atorvastatin (LIPITOR) 20 MG tablet 2 tabs po qd 90 tablet 1  . desloratadine (CLARINEX) 5 MG tablet Take 1 tablet (5 mg total) by mouth daily. 90 tablet 1  . glucose blood test strip Use as instructed 100 each 4  . metaxalone (SKELAXIN) 800 MG tablet metaxalone 800 mg tablet    . methocarbamol (ROBAXIN) 500 MG tablet Take 1 tablet (500 mg total) by mouth 2 (two) times daily. 20 tablet 0  . naproxen (NAPROSYN) 500 MG tablet Take 1 tablet (500 mg total) by mouth 2 (two) times daily with a meal. 30 tablet 0  . oxyCODONE-acetaminophen (PERCOCET) 10-325 MG tablet TAKE 1 TABLET BY MOUTH FOUR TIMES A DAY AS NEEDED  0  .  ALPRAZolam (XANAX) 0.25 MG tablet 1-2 tabs po bid prn anxiety 60 tablet 5  . citalopram (CELEXA) 20 MG tablet Take 1 tablet (20 mg total) by mouth daily. 90 tablet 1  . fluticasone (FLONASE) 50 MCG/ACT nasal spray PLACE 2 SPRAYS INTO BOTH NOSTRILS DAILY. 16 g 5  . hydrochlorothiazide (HYDRODIURIL) 25 MG tablet Take 1 tablet (25 mg total) by mouth daily. Needs follow up visit for further refills. 30 tablet 0  . lisinopril (PRINIVIL,ZESTRIL) 10 MG tablet Take 1 tablet (10 mg total) by mouth daily. OFFICE VISIT NEEDED FOR MORE REFILLS 30 tablet 0  . metFORMIN (GLUCOPHAGE) 500 MG tablet Take 1 tablet (500 mg total) by mouth 2 (two) times daily with a meal. 180 tablet 1   No facility-administered medications prior to visit.     No Known Allergies  ROS As per HPI  ZO:XWRUEAV bp today 152/83.  Repeat manual bp was 130/77 Blood pressure 130/77, pulse 64, temperature 97.9 F (36.6 C), temperature source Oral, resp. rate 16, height 5\' 1"  (1.549 m), weight 198 lb (89.8 kg), SpO2 98 %. Body mass index is 37.41 kg/m.  Gen: Alert, well appearing.  Patient is oriented to person, place, time, and situation. AFFECT: pleasant, lucid thought and speech. WUJ:WJXB: no injection, icteris, swelling, or exudate.  EOMI, PERRLA. Mouth: lips without lesion/swelling.  Oral mucosa pink and moist. Oropharynx without erythema, exudate, or swelling.  CV: RRR, no m/r/g.   LUNGS: CTA bilat, nonlabored resps, good aeration in all lung fields. EXT: no clubbing or cyanosis.  2+ pitting edema bilat. Foot exam -  no swelling, tenderness or skin or vascular lesions. Color and temperature is normal. Sensation is intact. Peripheral pulses are palpable. Toenails are normal.    LABS:  Lab Results  Component Value Date   TSH 1.90 01/01/2016   Lab Results  Component Value Date   WBC 10.5 01/01/2016   HGB 14.8 01/01/2016   HCT 43.6 01/01/2016   MCV 89.4 01/01/2016   PLT 331.0 01/01/2016   Lab Results  Component Value  Date   CREATININE 0.59 06/23/2016   BUN 16 06/23/2016   NA 139 06/23/2016   K 4.4 06/23/2016   CL 105 06/23/2016   CO2 29 06/23/2016   Lab Results  Component Value Date   ALT 28 01/01/2016   AST 20 01/01/2016   ALKPHOS 76 01/01/2016   BILITOT 0.2 01/01/2016   Lab Results  Component Value Date   CHOL 171 06/23/2016   Lab Results  Component Value Date   HDL 40.80 06/23/2016   No results found for: [facility] Lab Results  Component Value Date   TRIG 312.0 (H) 06/23/2016   Lab Results  Component Value Date   CHOLHDL 4 06/23/2016   Lab Results  Component Value Date   HGBA1C 7.5 (H) 06/23/2016    IMPRESSION AND PLAN:  1) DM 2; no home gluc monitoring. Hb a1c done at work biometric testing 2 mo ago---she doesn't recall number. Pneumovax 23->given today. Pt reminded of need for annual eye exam. Feet exam normal today.  2) HTN: No home monitoring. BP here decent.  No change in meds at this time.  3) HLD: tolerating statin.  Continue atorva 40mg  qd.  4) GAD: doing well on citalopram 20mg  qd and xanax 0.25mg , taking 2 tabs a few days a week avg. New rx for this today. [facility] UTD.  Get labs from biometric screening done by her employer 2 mo ago--pt to drop these off.   An After Visit Summary was printed and given to the patient.  FOLLOW UP: Return in about 3 months (around 04/09/2018) for annual CPE (fasting).  Signed:  Santiago Bumpers, MD           01/08/2018

## 2018-01-09 MED ORDER — DESLORATADINE 5 MG PO TABS: 5.0000 mg | ORAL_TABLET | Freq: Every day | ORAL | 3 refills | Status: DC

## 2018-01-09 NOTE — Telephone Encounter (Signed)
desloratadine RF'd.

## 2018-01-10 NOTE — Telephone Encounter (Signed)
Left detailed message on cell vm, okay per DPR.  

## 2018-03-21 DIAGNOSIS — M542 Cervicalgia: Secondary | ICD-10-CM | POA: Diagnosis not present

## 2018-03-21 DIAGNOSIS — M545 Low back pain: Secondary | ICD-10-CM | POA: Diagnosis not present

## 2018-03-26 ENCOUNTER — Telehealth: Payer: Self-pay | Admitting: *Deleted

## 2018-03-26 ENCOUNTER — Encounter: Payer: Self-pay | Admitting: Family Medicine

## 2018-03-26 NOTE — Telephone Encounter (Signed)
FYI

## 2018-03-26 NOTE — Telephone Encounter (Signed)
Copied from CRM [phone]. Topic: General - Other >> Mar 26, 2018  4:20 PM Marylen PontoMcneil, Ja-Kwan wrote: Reason for CRM: Adela LankJacqueline with [facility] stated that they received a Rx for ALPRAZolam Prudy Feeler(XANAX) 0.25 MG tablet and she wants to make provider aware that patient also takes oxyCODONE-acetaminophen (PERCOCET) 10-325 MG tablet (5 tablets per day).   Cb# [phone]

## 2018-03-28 NOTE — Telephone Encounter (Signed)
Pt states that the pharmacy will not refill xanax due to issue mentioned below. Pt checking status on Dr. Milinda CaveMcGowen following up with the pharmacy about the issue.

## 2018-03-28 NOTE — Telephone Encounter (Signed)
SW Dr. Milinda CaveMcGowen, he stated that yes he is aware and okay to fill alprazolam.  [facility].   Left message advising pt that [facility] and gave them the okay to fill her Rx.

## 2018-07-31 ENCOUNTER — Other Ambulatory Visit: Payer: Self-pay | Admitting: Family Medicine

## 2018-07-31 NOTE — Telephone Encounter (Signed)
Requested medication (s) are due for refill today: yes  Requested medication (s) are on the active medication list: yes  Last refill:  01/08/18  Future visit scheduled: no  Notes to clinic:  Not delegated    Requested Prescriptions  Pending Prescriptions Disp Refills   ALPRAZolam (XANAX) 0.25 MG tablet 180 tablet 1    Sig: 1-2 tabs po bid prn anxiety     Not Delegated - Psychiatry:  Anxiolytics/Hypnotics Failed - 07/31/2018  1:24 PM      Failed - This refill cannot be delegated      Failed - Urine Drug Screen completed in last 360 days.      Failed - Valid encounter within last 6 months    Recent Outpatient Visits          6 months ago Diabetes mellitus without complication (HCC)   [facility], Maryjean Morn, MD   10 months ago Essential hypertension   [place] [facility], Maryjean Morn, MD   10 months ago Bilateral lower extremity edema   [place] [facility], Renee A, DO   1 year ago Diabetes mellitus without complication [facility])   [place] [facility], Maryjean Morn, MD   2 years ago Anxiety and depression    [facility], Maryjean Morn, MD

## 2018-08-01 MED ORDER — ALPRAZOLAM 0.25 MG PO TABS: ORAL_TABLET | ORAL | 0 refills | Status: DC

## 2018-08-01 NOTE — Telephone Encounter (Signed)
RF request for alprazolam. Last OV 9.16.19 No upcoming - was advised to come in December, 2019 Last RX 9.16.19 # 180 x 1 rf.  Please advise.

## 2018-08-01 NOTE — Progress Notes (Signed)
CSC 08/01/18

## 2018-08-01 NOTE — Telephone Encounter (Addendum)
Tried to contact patient, left message for pt to CB.  Can you please print out controlled contract?

## 2018-08-01 NOTE — Telephone Encounter (Signed)
I will authorize rx for #30 tabs. Needs f/u for anxiety and she'll need to come by either before or right after the f/u to sign an updated controlled substance contract.-thx

## 2018-08-01 NOTE — Telephone Encounter (Signed)
CSC printed and on my desk.

## 2018-08-07 NOTE — Telephone Encounter (Signed)
Tried to contact patient, LMTCB to schedule f/u appt.

## 2018-08-09 ENCOUNTER — Other Ambulatory Visit: Payer: Self-pay | Admitting: Family Medicine

## 2018-08-21 NOTE — Telephone Encounter (Signed)
Letter placed up front to be mailed.

## 2018-08-30 ENCOUNTER — Encounter: Payer: Self-pay | Admitting: Family Medicine

## 2018-08-30 ENCOUNTER — Other Ambulatory Visit: Payer: Self-pay

## 2018-08-30 ENCOUNTER — Ambulatory Visit (INDEPENDENT_AMBULATORY_CARE_PROVIDER_SITE_OTHER): Payer: BLUE CROSS/BLUE SHIELD | Admitting: Family Medicine

## 2018-08-30 ENCOUNTER — Ambulatory Visit: Payer: BLUE CROSS/BLUE SHIELD | Admitting: Family Medicine

## 2018-08-30 VITALS — BP 135/83 | HR 88 | Wt 185.0 lb

## 2018-08-30 DIAGNOSIS — I10 Essential (primary) hypertension: Secondary | ICD-10-CM | POA: Diagnosis not present

## 2018-08-30 DIAGNOSIS — E7800 Pure hypercholesterolemia, unspecified: Secondary | ICD-10-CM

## 2018-08-30 DIAGNOSIS — E119 Type 2 diabetes mellitus without complications: Secondary | ICD-10-CM | POA: Diagnosis not present

## 2018-08-30 DIAGNOSIS — F3342 Major depressive disorder, recurrent, in full remission: Secondary | ICD-10-CM

## 2018-08-30 DIAGNOSIS — F411 Generalized anxiety disorder: Secondary | ICD-10-CM

## 2018-08-30 MED ORDER — HYDROCHLOROTHIAZIDE 25 MG PO TABS: 25.0000 mg | ORAL_TABLET | Freq: Every day | ORAL | 1 refills | Status: DC

## 2018-08-30 MED ORDER — FLUTICASONE PROPIONATE 50 MCG/ACT NA SUSP: 2.0000 | Freq: Every day | NASAL | 5 refills | Status: DC

## 2018-08-30 MED ORDER — ATORVASTATIN CALCIUM 40 MG PO TABS: 40.0000 mg | ORAL_TABLET | Freq: Every day | ORAL | 1 refills | Status: DC

## 2018-08-30 MED ORDER — LISINOPRIL 20 MG PO TABS: 20.0000 mg | ORAL_TABLET | Freq: Every day | ORAL | 5 refills | Status: DC

## 2018-08-30 MED ORDER — CITALOPRAM HYDROBROMIDE 20 MG PO TABS: 20.0000 mg | ORAL_TABLET | Freq: Every day | ORAL | 1 refills | Status: DC

## 2018-08-30 MED ORDER — ALPRAZOLAM 0.5 MG PO TABS: ORAL_TABLET | ORAL | 3 refills | Status: DC

## 2018-08-30 MED ORDER — DESLORATADINE 5 MG PO TABS: 5.0000 mg | ORAL_TABLET | Freq: Every day | ORAL | 3 refills | Status: DC

## 2018-08-30 NOTE — Progress Notes (Signed)
Virtual Visit via Video Note  I connected with [name] on 08/30/18 at  2:20 PM EDT by a video enabled telemedicine application and verified that I am speaking with the correct person using two identifiers.  Location patient: home Location provider:work or home office Persons participating in the virtual visit: patient, provider  I discussed the limitations of evaluation and management by telemedicine and the availability of in person appointments. The patient expressed understanding and agreed to proceed.  Telemedicine visit is a necessity given the COVID-19 restrictions in place at the current time.  HPI: 58 y/o WF being seen today for f/u DM 2, HTN, HLD, and chronic anxiety. She was supposed to have dropped off her biometric lab results done via her employer after last visit back in Sept 2019 but she never did. We did not change anything at last f/u visit 01/08/2018.  She reports she feels well.  DM 2: eats 2 meals a day (BF and late lunch, snack at bed), LOVES SWEETS. Takes metformin about 3 times per week b/c it causes her to feel "weird", couldn't really be more specific. Seems to not make her feel that way if she takes it with a meal, but for some reason she has trouble doing this. Lost some wt b/c eating better since working at home. No exercise.    HTN: home bp monitoring around 140/70s.  Highest systolic 150.  No diastolics over 80.  HR 80s-90s.  HLD: taking statin daily, no side effects.  Chronic anxiety: citalopram 20 mg qd and alprazolam 0.25mg , 1-2 bid prn anxiety. Most recent rx was for #30 only--to get [name] to this appt b/c f/u needed.  This was filled 08/01/18 per PMP AWARE.   She usually gets disp #120 of these tabs and from PMP aware RF review it looks like this lasts her 2 mo or so usually. She takes this approx 4 days per week , always takes 2 at a time.  No adverse side effects. Compliant with citalopram  qd.  Has been out x 5d and she states she can start to feel a bit  more emotionally brittle, can feel herself starting to cry easily at times.  No SI or HI. No panic attacks.  ROS: no CP, no SOB, no dizziness, no HAs, no rashes, no melena/hematochezia.  No polyuria or polydipsia.  + chronic neck and back pain. She is set to f/u with her pain mgmt MD, Dr. Ethelene Hal, in 1 wk per her report today.   Past Medical History:  Diagnosis Date  . Chronic neck pain    DDD/cervical spondylosis--C3-C6.  Mild central canal stenosis.  . Chronic pain syndrome    meds managed/rx'd by Dr. Ethelene Hal, then [name] referred to [facility] mgmt by Dr. Ethelene Hal 01/2017.  As of 02/2018, pain mgmt by Dr. Ethelene Hal again.  . DDD (degenerative disc disease), lumbar    MRI 05/13/16: mild DDD at L3-4, and ruptured disc at L4-5 to the right.  Dr. Ethelene Hal recommended right L5 selective nerve root block + referral to Dr. Darrelyn Hillock for surgery (as of 06/04/16 f/u).  As of 02/2018-->chr LBP lateralizing to R glut, pain meds are tx.  . Diabetes mellitus without complication (HCC) 01/04/2016   Dx'd with elevated random gluc + HbA1c 6.6%.  . Essential hypertension 09/05/2017   Started HCTZ  qd 09/05/17  . GAD (generalized anxiety disorder)    with anger control problems  . GERD (gastroesophageal reflux disease)   . History of pneumonia 2010   with  hemoptysis; Pulm eval done, f/u CXR 04/01/09 after pneumonia was normal.  . History of rheumatic fever    childhood--she reports no CV or Renal sequelae  . Hyperlipidemia 12/2015   started statin 12/2015  . IBS (irritable bowel syndrome)    Diarrhea predominant  . Impingement syndrome of right shoulder   . Pain in left knee    [facility]; MRI 09/2017--posterior horn medial meniscus tear (radial, not longitudinal).  Conservative rx as of 10/24/17 ortho f/u visit.  . Polyarthralgia 2017   Rheum eval (Dr. Nickola MajorHawkes) 10/2015: no inflammatory arthritis suspected.  . Tobacco abuse    30+ pack-yr hx  . Venous insufficiency 01/2012    Past Surgical History:  Procedure  Laterality Date  . CHOLECYSTECTOMY  2011  . COLONOSCOPY  approx 2007   Dr. Jarold MottoPatterson (normal per [name]--cannot locate path or procedure record)  . ESOPHAGOGASTRODUODENOSCOPY  "   "   Mild esophagogastric junction inflammation, otherwise normal.  . OVARY SURGERY  1993   left (ovarian cyst)  . VAGINAL HYSTERECTOMY  2003   Nonmalignant reasons (DUB)    Family History  Problem Relation Age of Onset  . Alcohol abuse Father   . Heart disease Maternal Grandfather     SOCIAL HX:  Social History   Socioeconomic History  . Marital status: Married    Spouse name: Not on file  . Number of children: Not on file  . Years of education: Not on file  . Highest education level: Not on file  Occupational History  . Not on file  Social Needs  . Financial resource strain: Not on file  . Food insecurity:    Worry: Not on file    Inability: Not on file  . Transportation needs:    Medical: Not on file    Non-medical: Not on file  Tobacco Use  . Smoking status: Light Tobacco Smoker    Types: Cigarettes  . Smokeless tobacco: Never Used  Substance and Sexual Activity  . Alcohol use: Yes    Comment: occ wine  . Drug use: No  . Sexual activity: Not Currently  Lifestyle  . Physical activity:    Days per week: Not on file    Minutes per session: Not on file  . Stress: Not on file  Relationships  . Social connections:    Talks on phone: Not on file    Gets together: Not on file    Attends religious service: Not on file    Active member of club or organization: Not on file    Attends meetings of clubs or organizations: Not on file    Relationship status: Not on file  Other Topics Concern  . Not on file  Social History Narrative   Divorced, one 228 y/o son.  Has 3 brothers and 3 sisters--healthy per [name] report.   [place], [place] and [place].   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   Tobacco: 30 + yrs, 1 ppd.   Rare glass of wine.   No drug use, no  exercise.   Has a shitzu, parrot, and a parakeet.  Lives alone in [place].      Current Outpatient Medications:  .  ALPRAZolam (XANAX) 0.25 MG tablet, 1-2 tabs po bid prn anxiety, Disp: 30 tablet, Rfl: 0 .  atorvastatin (LIPITOR) 40 MG tablet, Take 1 tablet (40 mg total) by mouth daily., Disp: 90 tablet, Rfl: 1 .  citalopram (CELEXA) 20 MG tablet, Take 1 tablet (20  mg total) by mouth daily., Disp: 90 tablet, Rfl: 1 .  desloratadine (CLARINEX) 5 MG tablet, Take 1 tablet (5 mg total) by mouth daily., Disp: 90 tablet, Rfl: 3 .  fluticasone (FLONASE) 50 MCG/ACT nasal spray, Place 2 sprays into both nostrils daily., Disp: 48 g, Rfl: 1 .  hydrochlorothiazide (HYDRODIURIL) 25 MG tablet, Take 1 tablet (25 mg total) by mouth daily. Needs follow up visit for further refills., Disp: 90 tablet, Rfl: 1 .  lisinopril (PRINIVIL,ZESTRIL) 10 MG tablet, Take 1 tablet (10 mg total) by mouth daily. OFFICE VISIT NEEDED FOR MORE REFILLS, Disp: 90 tablet, Rfl: 1 .  metaxalone (SKELAXIN) 800 MG tablet, metaxalone 800 mg tablet, Disp: , Rfl:  .  metFORMIN (GLUCOPHAGE) 500 MG tablet, Take 1 tablet (500 mg total) by mouth 2 (two) times daily with a meal., Disp: 180 tablet, Rfl: 1 .  oxyCODONE-acetaminophen (PERCOCET) 10-325 MG tablet, TAKE 1 TABLET BY MOUTH FOUR TIMES A DAY AS NEEDED, Disp: , Rfl: 0 .  albuterol (PROAIR HFA) 108 (90 Base) MCG/ACT inhaler, Inhale 1-2 puffs every 6 (six) hours as needed into the lungs for wheezing., Disp: 1 Inhaler, Rfl: 1 .  aluminum chloride (DRYSOL) 20 % external solution, Apply topically as needed., Disp: , Rfl:  .  glucose blood test strip, Use as instructed (Patient not taking: Reported on 08/30/2018), Disp: 100 each, Rfl: 4 .  methocarbamol (ROBAXIN) 500 MG tablet, Take 1 tablet (500 mg total) by mouth 2 (two) times daily. (Patient not taking: Reported on 08/30/2018), Disp: 20 tablet, Rfl: 0 .  naproxen (NAPROSYN) 500 MG tablet, Take 1 tablet (500 mg total) by mouth 2 (two) times  daily with a meal. (Patient not taking: Reported on 08/30/2018), Disp: 30 tablet, Rfl: 0  EXAM:  VITALS per patient if applicable: BP 135/83 (BP Location: Left Arm, Patient Position: Sitting, Cuff Size: Normal)   Pulse 88   Wt 185 lb (83.9 kg)   BMI 34.96 kg/m    GENERAL: alert, oriented, appears well and in no acute distress  HEENT: atraumatic, conjunttiva clear, no obvious abnormalities on inspection of external nose and ears  NECK: normal movements of the head and neck  LUNGS: on inspection no signs of respiratory distress, breathing rate appears normal, no obvious gross SOB, gasping or wheezing  CV: no obvious cyanosis  MS: moves all visible extremities without noticeable abnormality  PSYCH/NEURO: pleasant and cooperative, no obvious depression or anxiety, speech and thought processing grossly intact  LABS: none today  Lab Results  Component Value Date   TSH 1.90 01/01/2016   Lab Results  Component Value Date   WBC 10.5 01/01/2016   HGB 14.8 01/01/2016   HCT 43.6 01/01/2016   MCV 89.4 01/01/2016   PLT 331.0 01/01/2016   Lab Results  Component Value Date   CREATININE 0.59 06/23/2016   BUN 16 06/23/2016   NA 139 06/23/2016   K 4.4 06/23/2016   CL 105 06/23/2016   CO2 29 06/23/2016   Lab Results  Component Value Date   ALT 28 01/01/2016   AST 20 01/01/2016   ALKPHOS 76 01/01/2016   BILITOT 0.2 01/01/2016   Lab Results  Component Value Date   CHOL 171 06/23/2016   Lab Results  Component Value Date   HDL 40.80 06/23/2016   No results found for: [facility] Lab Results  Component Value Date   TRIG 312.0 (H) 06/23/2016   Lab Results  Component Value Date   CHOLHDL 4 06/23/2016   Lab Results  Component Value Date   HGBA1C 7.5 (H) 06/23/2016    ASSESSMENT AND PLAN:  Discussed the following assessment and plan:  1) DM 2, no home glucose monitoring. Last A1c via employer approx 10 mo ago and I never got a copy of these labs. Intolerant to  metformin. Depending on A1c, will need to find a different med treatment for her. She has some intermittent LE swelling so we will likely avoid actos.   A1c and lytes/cr --future. TSH screening future. Urine microalb/cr future.  2) HTN: not well controlled.  Increase lisinopril to 20mg  qd and continue hctz 25mg  qd. Lytes/cr future.  CBC and TSH future.  3) HLD: tolerating atorva 40mg  qd well.  FLP and hepatic panel--future.  4) GAD and hx of depression: has been stable on citalopram 20mg  qd and alprazolam. Will increase alpraz strength to 0.5mg  since she consistently takes two of the 0.25 mg tabs when she doses this.  Will dispense #60 with RF x 3. She will sign an updated CSC when she comes in for her labs soon. We will ask Dr. Ethelene Hal (pain) for their latest UDS.  I discussed the assessment and treatment plan with the patient. The patient was provided an opportunity to ask questions and all were answered. The patient agreed with the plan and demonstrated an understanding of the instructions.   The patient was advised to call back or seek an in-person evaluation if the symptoms worsen or if the condition fails to improve as anticipated.  F/u: 1mo, earlier if home bp monitoring still showing bp consistently >140s systolic.  Signed:  Santiago Bumpers, MD           08/30/2018

## 2018-09-07 DIAGNOSIS — M5136 Other intervertebral disc degeneration, lumbar region: Secondary | ICD-10-CM | POA: Diagnosis not present

## 2018-09-07 DIAGNOSIS — M5030 Other cervical disc degeneration, unspecified cervical region: Secondary | ICD-10-CM | POA: Diagnosis not present

## 2018-12-26 ENCOUNTER — Telehealth: Payer: Self-pay | Admitting: Family Medicine

## 2018-12-26 MED ORDER — ALPRAZOLAM 0.5 MG PO TABS: ORAL_TABLET | ORAL | 5 refills | Status: DC

## 2018-12-26 NOTE — Telephone Encounter (Signed)
Patient called back and scheduled CPE. She is going out of town today so she needs to pick up Rx today if possible.

## 2018-12-26 NOTE — Telephone Encounter (Signed)
ALPRAZolam (XANAX) 0.5 MG tablet [[id number]  [id number] - [place], [place] [address]

## 2018-12-26 NOTE — Telephone Encounter (Signed)
Ok, alpraz RF'd.

## 2018-12-26 NOTE — Telephone Encounter (Signed)
LM advising pt medication has been sent, okay per DPR.

## 2018-12-26 NOTE — Telephone Encounter (Signed)
LOV 08/30/2018 No future appointments scheduled. Last filled on 08/30/2018 #60 with 3 refills  Left message for patient to call back to schedule CPE in November. Also wanted to see if patient is needing a refill on the medication or if it was just an automatic refill request from pharmacy.

## 2019-02-25 ENCOUNTER — Ambulatory Visit: Payer: BLUE CROSS/BLUE SHIELD | Admitting: Family Medicine

## 2019-02-25 ENCOUNTER — Encounter: Payer: Self-pay | Admitting: Family Medicine

## 2019-02-25 ENCOUNTER — Other Ambulatory Visit: Payer: Self-pay

## 2019-02-25 ENCOUNTER — Other Ambulatory Visit: Payer: Self-pay | Admitting: Family Medicine

## 2019-02-25 VITALS — BP 116/78 | HR 77 | Temp 97.7°F | Resp 16 | Ht 63.0 in | Wt 179.8 lb

## 2019-02-25 DIAGNOSIS — Z1231 Encounter for screening mammogram for malignant neoplasm of breast: Secondary | ICD-10-CM

## 2019-02-25 DIAGNOSIS — Z135 Encounter for screening for eye and ear disorders: Secondary | ICD-10-CM | POA: Diagnosis not present

## 2019-02-25 DIAGNOSIS — I10 Essential (primary) hypertension: Secondary | ICD-10-CM

## 2019-02-25 DIAGNOSIS — E119 Type 2 diabetes mellitus without complications: Secondary | ICD-10-CM

## 2019-02-25 DIAGNOSIS — Z0000 Encounter for general adult medical examination without abnormal findings: Secondary | ICD-10-CM

## 2019-02-25 DIAGNOSIS — E7800 Pure hypercholesterolemia, unspecified: Secondary | ICD-10-CM

## 2019-02-25 DIAGNOSIS — Z23 Encounter for immunization: Secondary | ICD-10-CM

## 2019-02-25 LAB — LIPID PANEL
Cholesterol: 179 mg/dL (ref 0–200)
HDL: 43.6 mg/dL (ref >39.00)
NonHDL: 135.24
Total CHOL/HDL Ratio: 4
Triglycerides: 232 mg/dL — ABNORMAL HIGH (ref 0.0–149.0)
VLDL: 46.4 mg/dL — ABNORMAL HIGH (ref 0.0–40.0)

## 2019-02-25 LAB — CBC WITH DIFFERENTIAL/PLATELET
Basophils Absolute: 0.1 10*3/uL (ref 0.0–0.1)
Basophils Relative: 1.1 % (ref 0.0–3.0)
Eosinophils Absolute: 0.4 10*3/uL (ref 0.0–0.7)
Eosinophils Relative: 3.5 % (ref 0.0–5.0)
HCT: 44.7 % (ref 36.0–46.0)
Hemoglobin: 14.7 g/dL (ref 12.0–15.0)
Lymphocytes Relative: 20.5 % (ref 12.0–46.0)
Lymphs Abs: 2.1 10*3/uL (ref 0.7–4.0)
MCHC: 32.8 g/dL (ref 30.0–36.0)
MCV: 92.1 fl (ref 78.0–100.0)
Monocytes Absolute: 0.5 10*3/uL (ref 0.1–1.0)
Monocytes Relative: 5.3 % (ref 3.0–12.0)
Neutro Abs: 7.1 10*3/uL (ref 1.4–7.7)
Neutrophils Relative %: 69.6 % (ref 43.0–77.0)
Platelets: 351 10*3/uL (ref 150.0–400.0)
RBC: 4.86 Mil/uL (ref 3.87–5.11)
RDW: 14.6 % (ref 11.5–15.5)
WBC: 10.2 10*3/uL (ref 4.0–10.5)

## 2019-02-25 LAB — COMPREHENSIVE METABOLIC PANEL
ALT: 18 U/L (ref 0–35)
AST: 15 U/L (ref 0–37)
Albumin: 4.5 g/dL (ref 3.5–5.2)
Alkaline Phosphatase: 70 U/L (ref 39–117)
BUN: 20 mg/dL (ref 6–23)
CO2: 26 mEq/L (ref 19–32)
Calcium: 9.2 mg/dL (ref 8.4–10.5)
Chloride: 103 mEq/L (ref 96–112)
Creatinine, Ser: 0.75 mg/dL (ref 0.40–1.20)
GFR: 79.38 mL/min (ref >60.00)
Glucose, Bld: 153 mg/dL — ABNORMAL HIGH (ref 70–99)
Potassium: 4.5 mEq/L (ref 3.5–5.1)
Sodium: 138 mEq/L (ref 135–145)
Total Bilirubin: 0.4 mg/dL (ref 0.2–1.2)
Total Protein: 7.2 g/dL (ref 6.0–8.3)

## 2019-02-25 LAB — TSH: TSH: 0.61 u[IU]/mL (ref 0.35–4.50)

## 2019-02-25 LAB — LDL CHOLESTEROL, DIRECT: Direct LDL: 102 mg/dL

## 2019-02-25 LAB — HEMOGLOBIN A1C: Hgb A1c MFr Bld: 6.7 % — ABNORMAL HIGH (ref 4.6–6.5)

## 2019-02-25 MED ORDER — ATORVASTATIN CALCIUM 40 MG PO TABS: 40.0000 mg | ORAL_TABLET | Freq: Every day | ORAL | 3 refills | Status: DC

## 2019-02-25 NOTE — Patient Instructions (Signed)
Check with insurer about coverage for shingles vaccine (shingrix).   Health Maintenance, Female Adopting a healthy lifestyle and getting preventive care are important in promoting health and wellness. Ask your health care provider about:  The right schedule for you to have regular tests and exams.  Things you can do on your own to prevent diseases and keep yourself healthy. What should I know about diet, weight, and exercise? Eat a healthy diet   Eat a diet that includes plenty of vegetables, fruits, low-fat dairy products, and lean protein.  Do not eat a lot of foods that are high in solid fats, added sugars, or sodium. Maintain a healthy weight Body mass index (BMI) is used to identify weight problems. It estimates body fat based on height and weight. Your health care provider can help determine your BMI and help you achieve or maintain a healthy weight. Get regular exercise Get regular exercise. This is one of the most important things you can do for your health. Most adults should:  Exercise for at least 150 minutes each week. The exercise should increase your heart rate and make you sweat (moderate-intensity exercise).  Do strengthening exercises at least twice a week. This is in addition to the moderate-intensity exercise.  Spend less time sitting. Even light physical activity can be beneficial. Watch cholesterol and blood lipids Have your blood tested for lipids and cholesterol at 58 years of age, then have this test every 5 years. Have your cholesterol levels checked more often if:  Your lipid or cholesterol levels are high.  You are older than 58 years of age.  You are at high risk for heart disease. What should I know about cancer screening? Depending on your health history and family history, you may need to have cancer screening at various ages. This may include screening for:  Breast cancer.  Cervical cancer.  Colorectal cancer.  Skin cancer.  Lung cancer.  What should I know about heart disease, diabetes, and high blood pressure? Blood pressure and heart disease  High blood pressure causes heart disease and increases the risk of stroke. This is more likely to develop in people who have high blood pressure readings, are of African descent, or are overweight.  Have your blood pressure checked: ? Every 3-5 years if you are 36-32 years of age. ? Every year if you are 5 years old or older. Diabetes Have regular diabetes screenings. This checks your fasting blood sugar level. Have the screening done:  Once every three years after age 67 if you are at a normal weight and have a low risk for diabetes.  More often and at a younger age if you are overweight or have a high risk for diabetes. What should I know about preventing infection? Hepatitis B If you have a higher risk for hepatitis B, you should be screened for this virus. Talk with your health care provider to find out if you are at risk for hepatitis B infection. Hepatitis C Testing is recommended for:  Everyone born from [birth year] through 1965.  Anyone with known risk factors for hepatitis C. Sexually transmitted infections (STIs)  Get screened for STIs, including gonorrhea and chlamydia, if: ? You are sexually active and are younger than 58 years of age. ? You are older than 58 years of age and your health care provider tells you that you are at risk for this type of infection. ? Your sexual activity has changed since you were last screened, and you are at increased  risk for chlamydia or gonorrhea. Ask your health care provider if you are at risk.  Ask your health care provider about whether you are at high risk for HIV. Your health care provider may recommend a prescription medicine to help prevent HIV infection. If you choose to take medicine to prevent HIV, you should first get tested for HIV. You should then be tested every 3 months for as long as you are taking the medicine. Pregnancy   If you are about to stop having your period (premenopausal) and you may become pregnant, seek counseling before you get pregnant.  Take 400 to 800 micrograms (mcg) of folic acid every day if you become pregnant.  Ask for birth control (contraception) if you want to prevent pregnancy. Osteoporosis and menopause Osteoporosis is a disease in which the bones lose minerals and strength with aging. This can result in bone fractures. If you are 22 years old or older, or if you are at risk for osteoporosis and fractures, ask your health care provider if you should:  Be screened for bone loss.  Take a calcium or vitamin D supplement to lower your risk of fractures.  Be given hormone replacement therapy (HRT) to treat symptoms of menopause. Follow these instructions at home: Lifestyle  Do not use any products that contain nicotine or tobacco, such as cigarettes, e-cigarettes, and chewing tobacco. If you need help quitting, ask your health care provider.  Do not use street drugs.  Do not share needles.  Ask your health care provider for help if you need support or information about quitting drugs. Alcohol use  Do not drink alcohol if: ? Your health care provider tells you not to drink. ? You are pregnant, may be pregnant, or are planning to become pregnant.  If you drink alcohol: ? Limit how much you use to 0-1 drink a day. ? Limit intake if you are breastfeeding.  Be aware of how much alcohol is in your drink. In the [place] one drink equals one 12 oz bottle of beer (355 mL), one 5 oz glass of wine (148 mL), or one 1 oz glass of hard liquor (44 mL). General instructions  Schedule regular health, dental, and eye exams.  Stay current with your vaccines.  Tell your health care provider if: ? You often feel depressed. ? You have ever been abused or do not feel safe at home. Summary  Adopting a healthy lifestyle and getting preventive care are important in promoting health and  wellness.  Follow your health care provider's instructions about healthy diet, exercising, and getting tested or screened for diseases.  Follow your health care provider's instructions on monitoring your cholesterol and blood pressure. This information is not intended to replace advice given to you by your health care provider. Make sure you discuss any questions you have with your health care provider. Document Released: 10/25/2010 Document Revised: 04/04/2018 Document Reviewed: 04/04/2018 Elsevier Patient Education  2020 [employer].

## 2019-02-25 NOTE — Progress Notes (Signed)
Office Note 02/25/2019  CC:  Chief Complaint  Patient presents with  . Annual Exam    pt is fasting    HPI:  [name] is a 58 y.o. White female with DM 2, HTN, HLD, and anxiety (on benzos) who is here for annual health maintenance exam.  GYN MD: Dr. Henderson Cloud, she needs to re-establish with him.  DM 2: no home glucose monitoring, no polyuria or polydipsia. No burning, tingling, or numbness.  HTN: no home monitoring lately.  HLD: has been out of her statin for approx 2 mo. Diet: less snacking, lower carbs the last 6-8 mo. Exercise: none, but she is not sedentary. She quit smoking and then restarted so she is on nicotine patch again.  Past Medical History:  Diagnosis Date  . Chronic neck pain    DDD/cervical spondylosis--C3-C6.  Mild central canal stenosis.  . Chronic pain syndrome    meds managed/rx'd by Dr. Ethelene Hal, then pt referred [facility] by Dr. Ethelene Hal 01/2017.  As of 02/2018, pain mgmt by Dr. Ethelene Hal again.  . DDD (degenerative disc disease), lumbar    MRI 05/13/16: mild DDD at L3-4, and ruptured disc at L4-5 to the right.  Dr. Ethelene Hal recommended right L5 selective nerve root block + referral to Dr. Darrelyn Hillock for surgery (as of 06/04/16 f/u).  As of 02/2018-->chr LBP lateralizing to R glut, pain meds are tx.  . Diabetes mellitus without complication (HCC) 01/04/2016   Dx'd with elevated random gluc + HbA1c 6.6%.  . Essential hypertension 09/05/2017   Started HCTZ 25mg  qd 09/05/17  . GAD (generalized anxiety disorder)    with anger control problems  . GERD (gastroesophageal reflux disease)   . History of pneumonia 2010   with hemoptysis; Pulm eval done, f/u CXR 04/01/09 after pneumonia was normal.  . History of rheumatic fever    childhood--she reports no CV or Renal sequelae  . Hyperlipidemia 12/2015   started statin 12/2015  . IBS (irritable bowel syndrome)    Diarrhea predominant  . Impingement syndrome of right shoulder   . Pain in left knee     [facility]; MRI 09/2017--posterior horn medial meniscus tear (radial, not longitudinal).  Conservative rx as of 10/24/17 ortho f/u visit.  . Polyarthralgia 2017   Rheum eval (Dr. 2018) 10/2015: no inflammatory arthritis suspected.  . Tobacco abuse    30+ pack-yr hx  . Venous insufficiency 01/2012    Past Surgical History:  Procedure Laterality Date  . CHOLECYSTECTOMY  2011  . COLONOSCOPY  approx 2007   Dr. 2008 (normal per pt--cannot locate path or procedure record)  . ESOPHAGOGASTRODUODENOSCOPY  "   "   Mild esophagogastric junction inflammation, otherwise normal.  . OVARY SURGERY  1993   left (ovarian cyst)  . VAGINAL HYSTERECTOMY  2003   Nonmalignant reasons (DUB)    Family History  Problem Relation Age of Onset  . Alcohol abuse Father   . Heart disease Maternal Grandfather     Social History   Socioeconomic History  . Marital status: Married    Spouse name: Not on file  . Number of children: Not on file  . Years of education: Not on file  . Highest education level: Not on file  Occupational History  . Not on file  Social Needs  . Financial resource strain: Not on file  . Food insecurity    Worry: Not on file    Inability: Not on file  . Transportation needs    Medical:  Not on file    Non-medical: Not on file  Tobacco Use  . Smoking status: Light Tobacco Smoker    Types: Cigarettes  . Smokeless tobacco: Never Used  Substance and Sexual Activity  . Alcohol use: Yes    Comment: occ wine  . Drug use: No  . Sexual activity: Not Currently  Lifestyle  . Physical activity    Days per week: Not on file    Minutes per session: Not on file  . Stress: Not on file  Relationships  . Social Herbalist on phone: Not on file    Gets together: Not on file    Attends religious service: Not on file    Active member of club or organization: Not on file    Attends meetings of clubs or organizations: Not on file    Relationship status: Not on file  .  Intimate partner violence    Fear of current or ex partner: Not on file    Emotionally abused: Not on file    Physically abused: Not on file    Forced sexual activity: Not on file  Other Topics Concern  . Not on file  Social History Narrative   Divorced, one 11 y/o son.  Has 3 brothers and 3 sisters--healthy per pt report.   Orig from [place], [place] and relocated to [facility] 2003.   Works in Press photographer for [employer].   Tobacco: 30 + yrs, 1 ppd.   Rare glass of wine.   No drug use, no exercise.   Has a shitzu, parrot, and a parakeet.  Lives alone in [place].    Outpatient Medications Prior to Visit  Medication Sig Dispense Refill  . ALPRAZolam (XANAX) 0.5 MG tablet 1-2 tabs po bid prn increased anxiety 60 tablet 5  . atorvastatin (LIPITOR) 40 MG tablet Take 1 tablet (40 mg total) by mouth daily. 90 tablet 1  . citalopram (CELEXA) 20 MG tablet Take 1 tablet (20 mg total) by mouth daily. 90 tablet 1  . desloratadine (CLARINEX) 5 MG tablet Take 1 tablet (5 mg total) by mouth daily. 90 tablet 3  . fluticasone (FLONASE) 50 MCG/ACT nasal spray Place 2 sprays into both nostrils daily. 48 g 5  . hydrochlorothiazide (HYDRODIURIL) 25 MG tablet Take 1 tablet (25 mg total) by mouth daily. 90 tablet 1  . lisinopril (ZESTRIL) 20 MG tablet Take 1 tablet (20 mg total) by mouth daily. 30 tablet 5  . metFORMIN (GLUCOPHAGE) 500 MG tablet Take 1 tablet (500 mg total) by mouth 2 (two) times daily with a meal. 180 tablet 1  . oxyCODONE-acetaminophen (PERCOCET) 10-325 MG tablet TAKE 1 TABLET BY MOUTH FOUR TIMES A DAY AS NEEDED  0  . albuterol (PROAIR HFA) 108 (90 Base) MCG/ACT inhaler Inhale 1-2 puffs every 6 (six) hours as needed into the lungs for wheezing. (Patient not taking: Reported on 02/25/2019) 1 Inhaler 1  . aluminum chloride (DRYSOL) 20 % external solution Apply topically as needed.    Marland Kitchen glucose blood test strip Use as instructed (Patient not taking: Reported on 08/30/2018) 100 each 4   . metaxalone (SKELAXIN) 800 MG tablet metaxalone 800 mg tablet     No facility-administered medications prior to visit.     No Known Allergies  ROS Review of Systems  Constitutional: Negative for appetite change, chills, fatigue and fever.  HENT: Negative for congestion, dental problem, ear pain and sore throat.   Eyes: Negative for discharge, redness and visual disturbance.  Respiratory: Negative for cough, chest tightness, shortness of breath and wheezing.   Cardiovascular: Negative for chest pain, palpitations and leg swelling.  Gastrointestinal: Negative for abdominal pain, blood in stool, diarrhea, nausea and vomiting.  Genitourinary: Negative for difficulty urinating, dysuria, flank pain, frequency, hematuria and urgency.  Musculoskeletal: Positive for arthralgias (chronic R shoulder pain), back pain (chronic) and neck pain (chronic). Negative for joint swelling, myalgias and neck stiffness.  Skin: Negative for pallor and rash.  Neurological: Negative for dizziness, speech difficulty, weakness and headaches.  Hematological: Negative for adenopathy. Does not bruise/bleed easily.  Psychiatric/Behavioral: Negative for confusion and sleep disturbance. The patient is not nervous/anxious.     PE; Blood pressure 116/78, pulse 77, temperature 97.7 F (36.5 C), temperature source Temporal, resp. rate 16, height 5\' 3"  (1.6 m), weight 179 lb 12.8 oz (81.6 kg), SpO2 98 %. Body mass index is 31.85 kg/m. Exam chaperoned by Emi HolesBrittanae Staton, CMA.  Gen: Alert, well appearing.  Patient is oriented to person, place, time, and situation. AFFECT: pleasant, lucid thought and speech. ENT: Ears: EACs clear, normal epithelium.  TMs with good light reflex and landmarks bilaterally.  Eyes: no injection, icteris, swelling, or exudate.  EOMI, PERRLA. Nose: no drainage or turbinate edema/swelling.  No injection or focal lesion.  Mouth: lips without lesion/swelling.  Oral mucosa pink and moist.  Dentition  intact and without obvious caries or gingival swelling.  Oropharynx without erythema, exudate, or swelling.  Neck: supple/nontender.  No LAD, mass, or TM.  Carotid pulses 1+ bilaterally, without bruits. CV: RRR, no m/r/g.   LUNGS: CTA bilat, nonlabored resps, good aeration in all lung fields. ABD: soft, NT, ND, BS normal.  No hepatospenomegaly or mass.  No bruits. EXT: no clubbing, cyanosis, or edema.  Musculoskeletal: no joint swelling, erythema, warmth, or tenderness.  ROM of all joints intact. Skin - no sores or suspicious lesions or rashes or color changes Foot exam --no swelling, tenderness or skin or vascular lesions. Color and temperature is normal. Sensation is intact. Peripheral pulses are palpable. Toenails hypertrophic.  Calluses on great toes and heels.   Pertinent labs:  Lab Results  Component Value Date   TSH 1.90 01/01/2016   Lab Results  Component Value Date   WBC 10.5 01/01/2016   HGB 14.8 01/01/2016   HCT 43.6 01/01/2016   MCV 89.4 01/01/2016   PLT 331.0 01/01/2016   Lab Results  Component Value Date   CREATININE 0.59 06/23/2016   BUN 16 06/23/2016   NA 139 06/23/2016   K 4.4 06/23/2016   CL 105 06/23/2016   CO2 29 06/23/2016   Lab Results  Component Value Date   ALT 28 01/01/2016   AST 20 01/01/2016   ALKPHOS 76 01/01/2016   BILITOT 0.2 01/01/2016   Lab Results  Component Value Date   CHOL 171 06/23/2016   Lab Results  Component Value Date   HDL 40.80 06/23/2016   No results found for: [facility] Lab Results  Component Value Date   TRIG 312.0 (H) 06/23/2016   Lab Results  Component Value Date   CHOLHDL 4 06/23/2016   Lab Results  Component Value Date   HGBA1C 7.5 (H) 06/23/2016    ASSESSMENT AND PLAN:   1) Health maintenance exam: Reviewed age and gender appropriate health maintenance issues (prudent diet, regular exercise, health risks of tobacco and excessive alcohol, use of seatbelts, fire alarms in home, use of sunscreen).  Also  reviewed age and gender appropriate health screening as well as vaccine  recommendations. Vaccines: flu->given today.  shingrix->she'll check with her insurer. Labs: fasting HP, HbA1c, urine microalb/cr. Cervical ca screening: not candidate (remote hx of hysterectomy for benign dx). Breast ca screening: screening mammogram->she will get this via her GYN, Dr. Henderson Cloud. Colon ca screening: colonoscopy due->due to insurance issues she doesn't want to pursue this.  2) HTN: The current medical regimen is effective;  continue present plan and medications. Needs to monitor home bp's/hr some. Needs improved diet and exercise habits, needs to quit smoking. Lytes/cr today.  3) HLD: tolerating statin but ran out of meds 2 mo ago. FLP and hepatic panel today.  4) DM 2: no home glucose monitoring. Feet exam fine today. Referred pt for diabetic retinopathy screening eye exam today--she has never had one. HbA1c and urine microalbumin/cr today. Lytes/cr today.  5) GAD: coping pretty well with increased stressors of covid 19 and financial troubles of late. CSC UTD.  PMP AWARE reviewed today.  Most recent fill of alprazolam was 02/20/19, #60, rx by me. No red flags.  Gets pain meds through Dr. Grant Fontana office. Get most recent UDS from her pain mgmt MD.  An After Visit Summary was printed and given to the patient.  FOLLOW UP:  Return in about 3 months (around 05/28/2019) for routine chronic illness f/u.  Signed:  Santiago Bumpers, MD           02/25/2019

## 2019-02-25 NOTE — Addendum Note (Signed)
Addended by: Ralph Dowdy on: 02/25/2019 03:23 PM   Modules accepted: Orders

## 2019-04-05 DIAGNOSIS — M542 Cervicalgia: Secondary | ICD-10-CM | POA: Diagnosis not present

## 2019-04-05 DIAGNOSIS — M545 Low back pain: Secondary | ICD-10-CM | POA: Diagnosis not present

## 2019-04-05 DIAGNOSIS — G894 Chronic pain syndrome: Secondary | ICD-10-CM | POA: Diagnosis not present

## 2019-05-29 ENCOUNTER — Ambulatory Visit (INDEPENDENT_AMBULATORY_CARE_PROVIDER_SITE_OTHER): Payer: BC Managed Care – PPO | Admitting: Family Medicine

## 2019-05-29 ENCOUNTER — Encounter: Payer: Self-pay | Admitting: Family Medicine

## 2019-05-29 ENCOUNTER — Other Ambulatory Visit: Payer: Self-pay

## 2019-05-29 VITALS — BP 142/86 | HR 75

## 2019-05-29 DIAGNOSIS — I10 Essential (primary) hypertension: Secondary | ICD-10-CM

## 2019-05-29 DIAGNOSIS — E119 Type 2 diabetes mellitus without complications: Secondary | ICD-10-CM

## 2019-05-29 DIAGNOSIS — E7800 Pure hypercholesterolemia, unspecified: Secondary | ICD-10-CM | POA: Diagnosis not present

## 2019-05-29 DIAGNOSIS — F411 Generalized anxiety disorder: Secondary | ICD-10-CM

## 2019-05-29 MED ORDER — LISINOPRIL 20 MG PO TABS: 20.0000 mg | ORAL_TABLET | Freq: Every day | ORAL | 3 refills | Status: DC

## 2019-05-29 MED ORDER — METFORMIN HCL 500 MG PO TABS: 500.0000 mg | ORAL_TABLET | Freq: Two times a day (BID) | ORAL | 3 refills | Status: DC

## 2019-05-29 MED ORDER — HYDROCHLOROTHIAZIDE 25 MG PO TABS: 25.0000 mg | ORAL_TABLET | Freq: Every day | ORAL | 3 refills | Status: DC

## 2019-05-29 MED ORDER — CITALOPRAM HYDROBROMIDE 20 MG PO TABS: 20.0000 mg | ORAL_TABLET | Freq: Every day | ORAL | 3 refills | Status: DC

## 2019-05-29 NOTE — Progress Notes (Signed)
Virtual Visit via Video Note  I connected with [name] on 05/29/19 at  3:30 PM EST by a video enabled telemedicine application and verified that I am speaking with the correct person using two identifiers.  Location patient: home Location provider:work or home office Persons participating in the virtual visit: patient, provider  I discussed the limitations of evaluation and management by telemedicine and the availability of in person appointments. The patient expressed understanding and agreed to proceed.  Telemedicine visit is a necessity given the COVID-19 restrictions in place at the current time.  HPI: 59 y/o WF being seen today for 3 mo f/u DM 2, HTN, HLD, tob dependence. A/P as of last visit: "1) Health maintenance exam: Reviewed age and gender appropriate health maintenance issues (prudent diet, regular exercise, health risks of tobacco and excessive alcohol, use of seatbelts, fire alarms in home, use of sunscreen).  Also reviewed age and gender appropriate health screening as well as vaccine recommendations. Vaccines: flu->given today.  shingrix->she'll check with her insurer. Labs: fasting HP, HbA1c, urine microalb/cr. Cervical ca screening: not candidate (remote hx of hysterectomy for benign dx). Breast ca screening: screening mammogram->she will get this via her GYN, Dr. Henderson Cloud. Colon ca screening: colonoscopy due->due to insurance issues she doesn't want to pursue this.  2) HTN: The current medical regimen is effective;  continue present plan and medications. Needs to monitor home bp's/hr some. Needs improved diet and exercise habits, needs to quit smoking. Lytes/cr today.  3) HLD: tolerating statin but ran out of meds 2 mo ago. FLP and hepatic panel today.  4) DM 2: no home glucose monitoring. Feet exam fine today. Referred [name] for diabetic retinopathy screening eye exam today--she has never had one. HbA1c and urine microalbumin/cr today. Lytes/cr today.  5) GAD:  coping pretty well with increased stressors of covid 19 and financial troubles of late. CSC UTD.  PMP AWARE reviewed today.  Most recent fill of alprazolam was 02/20/19, #60, rx by me. No red flags.  Gets pain meds through Dr. Grant Fontana office. Get most recent UDS from her pain mgmt MD."  Interim hx: All labs last visit were good: A1c 6.7%.  She feels well, no complaints. NO home glucose monitoring. NO home bp checks lately. Starting to eat better since working from home. No formal exercise but active. Taking 1-2 alpraz daily, also compliant with citalopram.   Tolerating/compliant with statin.   PMP AWARE reviewed today: most recent rx for alprazolam 0.5mg  was filled 05/10/19, # 60, rx by me. No red flags.   ROS: no CP, no SOB, no wheezing, no cough, no dizziness, no HAs, no rashes, no melena/hematochezia.  No polyuria or polydipsia.  No myalgias or arthralgias.   Past Medical History:  Diagnosis Date  . Chronic neck pain    DDD/cervical spondylosis--C3-C6.  Mild central canal stenosis.  . Chronic pain syndrome    meds managed/rx'd by Dr. Ethelene Hal, then [name] referred [facility] by Dr. Ethelene Hal 01/2017.  As of 02/2018, pain mgmt by Dr. Ethelene Hal again.  . DDD (degenerative disc disease), lumbar    MRI 05/13/16: mild DDD at L3-4, and ruptured disc at L4-5 to the right.  Dr. Ethelene Hal recommended right L5 selective nerve root block + referral to Dr. Darrelyn Hillock for surgery (as of 06/04/16 f/u).  As of 02/2018-->chr LBP lateralizing to R glut, pain meds are tx.  . Diabetes mellitus without complication (HCC) 01/04/2016   Dx'd with elevated random gluc + HbA1c 6.6%.  . Essential hypertension  09/05/2017   Started HCTZ 25mg  qd 09/05/17  . GAD (generalized anxiety disorder)    with anger control problems  . GERD (gastroesophageal reflux disease)   . History of pneumonia 2010   with hemoptysis; Pulm eval done, f/u CXR 04/01/09 after pneumonia was normal.  . History of rheumatic fever     childhood--she reports no CV or Renal sequelae  . Hyperlipidemia 12/2015   started statin 12/2015  . IBS (irritable bowel syndrome)    Diarrhea predominant  . Impingement syndrome of right shoulder   . Pain in left knee    [facility]; MRI 09/2017--posterior horn medial meniscus tear (radial, not longitudinal).  Conservative rx as of 10/24/17 ortho f/u visit.  . Polyarthralgia 2017   Rheum eval (Dr. Trudie Reed) 10/2015: no inflammatory arthritis suspected.  . Tobacco abuse    30+ pack-yr hx  . Venous insufficiency 01/2012    Past Surgical History:  Procedure Laterality Date  . CHOLECYSTECTOMY  2011  . COLONOSCOPY  approx 2007   Dr. Sharlett Iles (normal per [name]--cannot locate path or procedure record)  . ESOPHAGOGASTRODUODENOSCOPY  "   "   Mild esophagogastric junction inflammation, otherwise normal.  . OVARY SURGERY  1993   left (ovarian cyst)  . VAGINAL HYSTERECTOMY  2003   Nonmalignant reasons (DUB)    Family History  Problem Relation Age of Onset  . Alcohol abuse Father   . Heart disease Maternal Grandfather     SOCIAL HX:  Social History   Socioeconomic History  . Marital status: Married    Spouse name: Not on file  . Number of children: Not on file  . Years of education: Not on file  . Highest education level: Not on file  Occupational History  . Not on file  Tobacco Use  . Smoking status: Light Tobacco Smoker    Types: Cigarettes  . Smokeless tobacco: Never Used  Substance and Sexual Activity  . Alcohol use: Yes    Comment: occ wine  . Drug use: No  . Sexual activity: Not Currently  Other Topics Concern  . Not on file  Social History Narrative   Divorced, one 39 y/o son.  Has 3 brothers and 3 sisters--healthy per [name] report.   Orig from [place], [place] and relocated to [facility] [place].   Works in Press photographer for [employer].   Tobacco: 30 + yrs, 1 ppd.   Rare glass of wine.   No drug use, no exercise.   Has a shitzu, parrot, and a parakeet.  Lives alone  in [place].   Social Determinants of Health   Financial Resource Strain:   . Difficulty of Paying Living Expenses: Not on file  Food Insecurity:   . Worried About Charity fundraiser in the Last Year: Not on file  . Ran Out of Food in the Last Year: Not on file  Transportation Needs:   . Lack of Transportation (Medical): Not on file  . Lack of Transportation (Non-Medical): Not on file  Physical Activity:   . Days of Exercise per Week: Not on file  . Minutes of Exercise per Session: Not on file  Stress:   . Feeling of Stress : Not on file  Social Connections:   . Frequency of Communication with Friends and Family: Not on file  . Frequency of Social Gatherings with Friends and Family: Not on file  . Attends Religious Services: Not on file  . Active Member of Clubs or Organizations: Not on file  . Attends  Club or Organization Meetings: Not on file  . Marital Status: Not on file     Current Outpatient Medications:  .  ALPRAZolam (XANAX) 0.5 MG tablet, 1-2 tabs po bid prn increased anxiety, Disp: 60 tablet, Rfl: 5 .  atorvastatin (LIPITOR) 40 MG tablet, Take 1 tablet (40 mg total) by mouth daily., Disp: 90 tablet, Rfl: 3 .  citalopram (CELEXA) 20 MG tablet, Take 1 tablet (20 mg total) by mouth daily., Disp: 90 tablet, Rfl: 1 .  desloratadine (CLARINEX) 5 MG tablet, Take 1 tablet (5 mg total) by mouth daily., Disp: 90 tablet, Rfl: 3 .  fluticasone (FLONASE) 50 MCG/ACT nasal spray, Place 2 sprays into both nostrils daily., Disp: 48 g, Rfl: 5 .  hydrochlorothiazide (HYDRODIURIL) 25 MG tablet, Take 1 tablet (25 mg total) by mouth daily., Disp: 90 tablet, Rfl: 1 .  lisinopril (ZESTRIL) 20 MG tablet, Take 1 tablet (20 mg total) by mouth daily., Disp: 30 tablet, Rfl: 5 .  oxyCODONE-acetaminophen (PERCOCET) 10-325 MG tablet, TAKE 1 TABLET BY MOUTH FOUR TIMES A DAY AS NEEDED, Disp: , Rfl: 0 .  albuterol (PROAIR HFA) 108 (90 Base) MCG/ACT inhaler, Inhale 1-2 puffs every 6 (six) hours as  needed into the lungs for wheezing. (Patient not taking: Reported on 02/25/2019), Disp: 1 Inhaler, Rfl: 1 .  aluminum chloride (DRYSOL) 20 % external solution, Apply topically as needed., Disp: , Rfl:  .  glucose blood test strip, Use as instructed (Patient not taking: Reported on 08/30/2018), Disp: 100 each, Rfl: 4 .  metFORMIN (GLUCOPHAGE) 500 MG tablet, Take 1 tablet (500 mg total) by mouth 2 (two) times daily with a meal. (Patient not taking: Reported on 05/29/2019), Disp: 180 tablet, Rfl: 1  EXAM:  VITALS per patient if applicable:  GENERAL: alert, oriented, appears well and in no acute distress  HEENT: atraumatic, conjunttiva clear, no obvious abnormalities on inspection of external nose and ears  NECK: normal movements of the head and neck  LUNGS: on inspection no signs of respiratory distress, breathing rate appears normal, no obvious gross SOB, gasping or wheezing  CV: no obvious cyanosis  MS: moves all visible extremities without noticeable abnormality  PSYCH/NEURO: pleasant and cooperative, no obvious depression or anxiety, speech and thought processing grossly intact  LABS: none today  Lab Results  Component Value Date   TSH 0.61 02/25/2019   Lab Results  Component Value Date   WBC 10.2 02/25/2019   HGB 14.7 02/25/2019   HCT 44.7 02/25/2019   MCV 92.1 02/25/2019   PLT 351.0 02/25/2019   Lab Results  Component Value Date   CREATININE 0.75 02/25/2019   BUN 20 02/25/2019   NA 138 02/25/2019   K 4.5 02/25/2019   CL 103 02/25/2019   CO2 26 02/25/2019   Lab Results  Component Value Date   ALT 18 02/25/2019   AST 15 02/25/2019   ALKPHOS 70 02/25/2019   BILITOT 0.4 02/25/2019   Lab Results  Component Value Date   CHOL 179 02/25/2019   Lab Results  Component Value Date   HDL 43.60 02/25/2019   No results found for: [facility] Lab Results  Component Value Date   TRIG 232.0 (H) 02/25/2019   Lab Results  Component Value Date   CHOLHDL 4 02/25/2019   Lab  Results  Component Value Date   HGBA1C 6.7 (H) 02/25/2019    ASSESSMENT AND PLAN:  Discussed the following assessment and plan:  1) HTN; bp a little up today. She will start monitoring  at home regularly and call/return if not consistently <130/80. No changes at this time. Lytes/cr good 3 mo ago.  2) DM; good control historically. Compliant with meds but no home monitoring. No changes today. Plan repeat a1c 6 mo. Diet is improving.  Needs to start exercising more.  3) HLD: tolerating statin. FLP and hepatic panel good 3 mo ago. Plan recheck labs 6 mo.  4) GAD: The current medical regimen is effective;  continue present plan and medications. CSC UTD. No new rx for alprazolam was needed today.  She has chronic pain syndrome managed by pain mgmt specialist.   I discussed the assessment and treatment plan with the patient. The patient was provided an opportunity to ask questions and all were answered. The patient agreed with the plan and demonstrated an understanding of the instructions.   The patient was advised to call back or seek an in-person evaluation if the symptoms worsen or if the condition fails to improve as anticipated.  F/u: 6 mo  Signed:  Santiago Bumpers, MD           05/29/2019

## 2019-05-29 NOTE — Progress Notes (Signed)
error 

## 2019-07-06 ENCOUNTER — Other Ambulatory Visit: Payer: Self-pay | Admitting: Family Medicine

## 2019-07-08 NOTE — Telephone Encounter (Signed)
Requesting:alprazolam Contract:02/25/19 SCB:IPJR Last Visit:05/29/19 Next Visit:11/27/19 Last Refill:12/26/18(60,5)  Please Advise. Medication pending

## 2019-07-08 NOTE — Telephone Encounter (Signed)
Pt notified Rx sent 

## 2019-08-20 DIAGNOSIS — Z79899 Other long term (current) drug therapy: Secondary | ICD-10-CM | POA: Diagnosis not present

## 2019-08-20 DIAGNOSIS — M545 Low back pain: Secondary | ICD-10-CM | POA: Diagnosis not present

## 2019-10-21 ENCOUNTER — Other Ambulatory Visit: Payer: Self-pay

## 2019-10-21 MED ORDER — FLUTICASONE PROPIONATE 50 MCG/ACT NA SUSP: 2.0000 | Freq: Every day | NASAL | 5 refills | Status: DC

## 2019-10-21 MED ORDER — DESLORATADINE 5 MG PO TABS: 5.0000 mg | ORAL_TABLET | Freq: Every day | ORAL | 3 refills | Status: DC

## 2019-11-15 ENCOUNTER — Other Ambulatory Visit: Payer: Self-pay | Admitting: Family Medicine

## 2019-11-18 ENCOUNTER — Ambulatory Visit: Payer: BC Managed Care – PPO

## 2019-11-25 ENCOUNTER — Encounter: Payer: Self-pay | Admitting: Family Medicine

## 2019-11-27 ENCOUNTER — Ambulatory Visit: Payer: BC Managed Care – PPO | Admitting: Family Medicine

## 2019-12-02 ENCOUNTER — Other Ambulatory Visit: Payer: Self-pay

## 2019-12-02 ENCOUNTER — Ambulatory Visit (INDEPENDENT_AMBULATORY_CARE_PROVIDER_SITE_OTHER): Payer: BC Managed Care – PPO

## 2019-12-02 DIAGNOSIS — E119 Type 2 diabetes mellitus without complications: Secondary | ICD-10-CM | POA: Diagnosis not present

## 2019-12-02 DIAGNOSIS — I10 Essential (primary) hypertension: Secondary | ICD-10-CM | POA: Diagnosis not present

## 2019-12-02 DIAGNOSIS — E7800 Pure hypercholesterolemia, unspecified: Secondary | ICD-10-CM | POA: Diagnosis not present

## 2019-12-02 LAB — CBC WITH DIFFERENTIAL/PLATELET
Basophils Absolute: 0.1 10*3/uL (ref 0.0–0.1)
Basophils Relative: 0.8 % (ref 0.0–3.0)
Eosinophils Absolute: 0.3 10*3/uL (ref 0.0–0.7)
Eosinophils Relative: 3.4 % (ref 0.0–5.0)
HCT: 45.4 % (ref 36.0–46.0)
Hemoglobin: 14.9 g/dL (ref 12.0–15.0)
Lymphocytes Relative: 33.6 % (ref 12.0–46.0)
Lymphs Abs: 2.8 10*3/uL (ref 0.7–4.0)
MCHC: 32.8 g/dL (ref 30.0–36.0)
MCV: 93 fl (ref 78.0–100.0)
Monocytes Absolute: 0.7 10*3/uL (ref 0.1–1.0)
Monocytes Relative: 7.9 % (ref 3.0–12.0)
Neutro Abs: 4.6 10*3/uL (ref 1.4–7.7)
Neutrophils Relative %: 54.3 % (ref 43.0–77.0)
Platelets: 312 10*3/uL (ref 150.0–400.0)
RBC: 4.89 Mil/uL (ref 3.87–5.11)
RDW: 14.1 % (ref 11.5–15.5)
WBC: 8.4 10*3/uL (ref 4.0–10.5)

## 2019-12-02 LAB — COMPREHENSIVE METABOLIC PANEL
ALT: 27 U/L (ref 0–35)
AST: 21 U/L (ref 0–37)
Albumin: 4.2 g/dL (ref 3.5–5.2)
Alkaline Phosphatase: 74 U/L (ref 39–117)
BUN: 15 mg/dL (ref 6–23)
CO2: 29 mEq/L (ref 19–32)
Calcium: 10.4 mg/dL (ref 8.4–10.5)
Chloride: 101 mEq/L (ref 96–112)
Creatinine, Ser: 0.78 mg/dL (ref 0.40–1.20)
GFR: 75.67 mL/min (ref >60.00)
Glucose, Bld: 129 mg/dL — ABNORMAL HIGH (ref 70–99)
Potassium: 4.8 mEq/L (ref 3.5–5.1)
Sodium: 138 mEq/L (ref 135–145)
Total Bilirubin: 0.6 mg/dL (ref 0.2–1.2)
Total Protein: 7.2 g/dL (ref 6.0–8.3)

## 2019-12-02 LAB — LIPID PANEL
Cholesterol: 197 mg/dL (ref 0–200)
HDL: 41.3 mg/dL (ref >39.00)
LDL Cholesterol: 119 mg/dL — ABNORMAL HIGH (ref 0–99)
NonHDL: 156
Total CHOL/HDL Ratio: 5
Triglycerides: 186 mg/dL — ABNORMAL HIGH (ref 0.0–149.0)
VLDL: 37.2 mg/dL (ref 0.0–40.0)

## 2019-12-02 LAB — MICROALBUMIN / CREATININE URINE RATIO
Creatinine,U: 375.4 mg/dL
Microalb Creat Ratio: 0.7 mg/g (ref 0.0–30.0)
Microalb, Ur: 2.8 mg/dL — ABNORMAL HIGH (ref 0.0–1.9)

## 2019-12-02 LAB — HEMOGLOBIN A1C: Hgb A1c MFr Bld: 7.1 % — ABNORMAL HIGH (ref 4.6–6.5)

## 2019-12-05 ENCOUNTER — Encounter: Payer: Self-pay | Admitting: Family Medicine

## 2019-12-05 ENCOUNTER — Other Ambulatory Visit: Payer: Self-pay

## 2019-12-05 ENCOUNTER — Telehealth (INDEPENDENT_AMBULATORY_CARE_PROVIDER_SITE_OTHER): Payer: BC Managed Care – PPO | Admitting: Family Medicine

## 2019-12-05 DIAGNOSIS — E119 Type 2 diabetes mellitus without complications: Secondary | ICD-10-CM

## 2019-12-05 DIAGNOSIS — F988 Other specified behavioral and emotional disorders with onset usually occurring in childhood and adolescence: Secondary | ICD-10-CM | POA: Diagnosis not present

## 2019-12-05 DIAGNOSIS — I10 Essential (primary) hypertension: Secondary | ICD-10-CM

## 2019-12-05 DIAGNOSIS — Z79899 Other long term (current) drug therapy: Secondary | ICD-10-CM

## 2019-12-05 DIAGNOSIS — E7800 Pure hypercholesterolemia, unspecified: Secondary | ICD-10-CM

## 2019-12-05 DIAGNOSIS — F411 Generalized anxiety disorder: Secondary | ICD-10-CM

## 2019-12-05 MED ORDER — ALPRAZOLAM 0.5 MG PO TABS: ORAL_TABLET | ORAL | 5 refills | Status: DC

## 2019-12-05 NOTE — Progress Notes (Signed)
Virtual Visit via Video Note  I connected with pt on 12/05/19 at  1:00 PM EDT by a video enabled telemedicine application and verified that I am speaking with the correct person using two identifiers.  Location patient: home Location provider:work or home office Persons participating in the virtual visit: patient, provider  I discussed the limitations of evaluation and management by telemedicine and the availability of in person appointments. The patient expressed understanding and agreed to proceed.  Telemedicine visit is a necessity given the COVID-19 restrictions in place at the current time.  HPI: 59 y/o WF being seen today for 6 mo f/u DM 2, HLD, HTN, chronic anxiety, tob dependence. A/P as of last visit: "1) HTN; bp a little up today. She will start monitoring at home regularly and call/return if not consistently <130/80. No changes at this time. Lytes/cr good 3 mo ago.  2) DM; good control historically. Compliant with meds but no home monitoring. No changes today. Plan repeat a1c 6 mo. Diet is improving.  Needs to start exercising more.  3) HLD: tolerating statin. FLP and hepatic panel good 3 mo ago. Plan recheck labs 6 mo.  4) GAD: The current medical regimen is effective;  continue present plan and medications. CSC UTD. No new rx for alprazolam was needed today.  She has chronic pain syndrome managed by pain mgmt specialist."  INTERIM HX: Feeling fine, working from home. LDL and trigs on labs 12/02/19 up a little and I recommended inc atorva to 80 qd, a1c up to 7.1% and I rec'd inc metformin to 1000 mg bid. She has not been taking her meds at all lately as rx'd plus has run into signif financial constraints so she chose to decline the changes I recommended for right now, and try to get more compliant with current regimen.  Not working on exercise or diet right now. We discussed possibly starting the intermittent fasting diet. Says she used to be on stimulant for  ADD in school, also in 70s, 30s. Used to be helpful. Has trouble focusing, staying on task, losing things, poor organization, affects work and relationships at home.    Mood is fine.  Anxiety level is stable. Takes cital and alpraz as rx'd and these help. PMP AWARE reviewed today: most recent rx for alpraz 0.5mg  was filled 10/31/19, # 60, rx by me.  Her oxy/apap is rx'd by Dr. Kristian Covey at [facility] ortho. No red flags.  ROS: See pertinent positives and negatives per HPI.  Past Medical History:  Diagnosis Date  . Chronic neck pain    DDD/cervical spondylosis--C3-C6.  Mild central canal stenosis.  . Chronic pain syndrome    pain mgmt by Dr. Ethelene Hal   . DDD (degenerative disc disease), lumbar    MRI 05/13/16: mild DDD at L3-4, and ruptured disc at L4-5 to the right.  Dr. Ethelene Hal recommended right L5 selective nerve root block + referral to Dr. Darrelyn Hillock for surgery (as of 06/04/16 f/u).  As of 02/2018-->chr LBP lateralizing to R glut, pain meds are tx.  . Diabetes mellitus without complication (HCC) 01/04/2016   Dx'd with elevated random gluc + HbA1c 6.6%.  . Essential hypertension 09/05/2017   Started HCTZ 25mg  qd 09/05/17  . GAD (generalized anxiety disorder)    with anger control problems  . GERD (gastroesophageal reflux disease)   . History of pneumonia 2010   with hemoptysis; Pulm eval done, f/u CXR 04/01/09 after pneumonia was normal.  . History of rheumatic fever  childhood--she reports no CV or Renal sequelae  . Hyperlipidemia 12/2015   started statin 12/2015  . IBS (irritable bowel syndrome)    Diarrhea predominant  . Impingement syndrome of right shoulder   . Pain in left knee    [facility]; MRI 09/2017--posterior horn medial meniscus tear (radial, not longitudinal).  Conservative rx as of 10/24/17 ortho f/u visit.  . Polyarthralgia 2017   Rheum eval (Dr. Nickola Major) 10/2015: no inflammatory arthritis suspected.  . Tobacco abuse    30+ pack-yr hx  . Venous insufficiency 01/2012     Past Surgical History:  Procedure Laterality Date  . CHOLECYSTECTOMY  2011  . COLONOSCOPY  approx 2007   Dr. Jarold Motto (normal per pt--cannot locate path or procedure record)  . ESOPHAGOGASTRODUODENOSCOPY  "   "   Mild esophagogastric junction inflammation, otherwise normal.  . OVARY SURGERY  1993   left (ovarian cyst)  . VAGINAL HYSTERECTOMY  2003   Nonmalignant reasons (DUB)    Family History  Problem Relation Age of Onset  . Alcohol abuse Father   . Heart disease Maternal Grandfather      Current Outpatient Medications:  .  ALPRAZolam (XANAX) 0.5 MG tablet, TAKE 1-2 TABLETS TWICE A DAY AS NEEDED FOR INCREASED ANXIETY, Disp: 60 tablet, Rfl: 5 .  albuterol (PROAIR HFA) 108 (90 Base) MCG/ACT inhaler, Inhale 1-2 puffs every 6 (six) hours as needed into the lungs for wheezing. (Patient not taking: Reported on 02/25/2019), Disp: 1 Inhaler, Rfl: 1 .  aluminum chloride (DRYSOL) 20 % external solution, Apply topically as needed., Disp: , Rfl:  .  atorvastatin (LIPITOR) 40 MG tablet, Take 1 tablet (40 mg total) by mouth daily., Disp: 90 tablet, Rfl: 3 .  citalopram (CELEXA) 20 MG tablet, Take 1 tablet (20 mg total) by mouth daily., Disp: 90 tablet, Rfl: 3 .  desloratadine (CLARINEX) 5 MG tablet, Take 1 tablet (5 mg total) by mouth daily., Disp: 90 tablet, Rfl: 3 .  fluticasone (FLONASE) 50 MCG/ACT nasal spray, Place 2 sprays into both nostrils daily., Disp: 48 g, Rfl: 5 .  glucose blood test strip, Use as instructed (Patient not taking: Reported on 08/30/2018), Disp: 100 each, Rfl: 4 .  hydrochlorothiazide (HYDRODIURIL) 25 MG tablet, Take 1 tablet (25 mg total) by mouth daily., Disp: 90 tablet, Rfl: 3 .  lisinopril (ZESTRIL) 20 MG tablet, Take 1 tablet (20 mg total) by mouth daily., Disp: 90 tablet, Rfl: 3 .  metFORMIN (GLUCOPHAGE) 500 MG tablet, Take 1 tablet (500 mg total) by mouth 2 (two) times daily with a meal., Disp: 180 tablet, Rfl: 3 .  oxyCODONE-acetaminophen (PERCOCET) 10-325 MG  tablet, TAKE 1 TABLET BY MOUTH FOUR TIMES A DAY AS NEEDED, Disp: , Rfl: 0  EXAM:  VITALS per patient if applicable:There were no vitals taken for this visit.   GENERAL: alert, oriented, appears well and in no acute distress  HEENT: atraumatic, conjunttiva clear, no obvious abnormalities on inspection of external nose and ears  NECK: normal movements of the head and neck  LUNGS: on inspection no signs of respiratory distress, breathing rate appears normal, no obvious gross SOB, gasping or wheezing  CV: no obvious cyanosis  MS: moves all visible extremities without noticeable abnormality  PSYCH/NEURO: pleasant and cooperative, no obvious depression or anxiety, speech and thought processing grossly intact  LABS: none today  Lab Results  Component Value Date   TSH 0.61 02/25/2019   Lab Results  Component Value Date   WBC 8.4 12/02/2019   HGB  14.9 12/02/2019   HCT 45.4 12/02/2019   MCV 93.0 12/02/2019   PLT 312.0 12/02/2019   Lab Results  Component Value Date   CREATININE 0.78 12/02/2019   BUN 15 12/02/2019   NA 138 12/02/2019   K 4.8 12/02/2019   CL 101 12/02/2019   CO2 29 12/02/2019   Lab Results  Component Value Date   ALT 27 12/02/2019   AST 21 12/02/2019   ALKPHOS 74 12/02/2019   BILITOT 0.6 12/02/2019   Lab Results  Component Value Date   CHOL 197 12/02/2019   Lab Results  Component Value Date   HDL 41.30 12/02/2019   Lab Results  Component Value Date   LDLCALC 119 (H) 12/02/2019   Lab Results  Component Value Date   TRIG 186.0 (H) 12/02/2019   Lab Results  Component Value Date   CHOLHDL 5 12/02/2019   Lab Results  Component Value Date   HGBA1C 7.1 (H) 12/02/2019   ASSESSMENT AND PLAN:  Discussed the following assessment and plan:  1) Adult ADD: will get her back on med. Adderall xr 20mg  qd will be started but will wait on rx'ing until after she comes in for UDS and sign new CSC.  She'll make lab appt, UDS ordered--future.  2)  GAD/depression: stable on citalopram 20mg  qd and alpraz 0.5mg  bid. Alpraz 0.5mg , 1 bid prn, #60, RF x 5 eRx'd today.  3) DM 2: noncompliant with meds, diet, and exercise. A1c 7.1%3 d/a. Urine microalb/cr neg. She is going to restart metformin 500 mg bid, try intermittent fasting diet and get more exercise. Repeat a1c 3 mo.  4) HLD; noncompliant with med. Restart atorva 40mg  qd. FLP and hepatic panel repeat 3 mo.  5) HTN: not clear if she has been compliant with these meds or not. Encouraged complete compliance with meds, diet, exercise. Recent lytes/cr stable. Repeat CMET 3 mo.  -we discussed possible serious and likely etiologies, options for evaluation and workup, limitations of telemedicine visit vs in person visit, treatment, treatment risks and precautions. Pt prefers to treat via telemedicine empirically rather then risking or undertaking an in person visit at this moment. Patient agrees to seek prompt in person care if worsening, new symptoms arise, or if is not improving with treatment.   I discussed the assessment and treatment plan with the patient. The patient was provided an opportunity to ask questions and all were answered. The patient agreed with the plan and demonstrated an understanding of the instructions.   The patient was advised to call back or seek an in-person evaluation if the symptoms worsen or if the condition fails to improve as anticipated.  F/u: 3 mo IN PERSON  Signed:  , MD           12/05/2019

## 2019-12-11 DIAGNOSIS — M62830 Muscle spasm of back: Secondary | ICD-10-CM | POA: Diagnosis not present

## 2020-04-07 ENCOUNTER — Telehealth (INDEPENDENT_AMBULATORY_CARE_PROVIDER_SITE_OTHER): Payer: BC Managed Care – PPO | Admitting: Family Medicine

## 2020-04-07 ENCOUNTER — Encounter: Payer: Self-pay | Admitting: Family Medicine

## 2020-04-07 DIAGNOSIS — J040 Acute laryngitis: Secondary | ICD-10-CM

## 2020-04-07 MED ORDER — PREDNISONE 50 MG PO TABS: 50.0000 mg | ORAL_TABLET | Freq: Every day | ORAL | 0 refills | Status: DC

## 2020-04-07 NOTE — Progress Notes (Signed)
VIRTUAL VISIT VIA VIDEO  I connected with [name] on 04/07/20 at  1:00 PM EST by elemedicine application and verified that I am speaking with the correct person using two identifiers. Location patient: Home Location provider: [facility], Office Persons participating in the virtual visit: Patient, Dr. Claiborne Billings and Paulina Fusi, CMA  I discussed the limitations of evaluation and management by telemedicine and the availability of in person appointments. The patient expressed understanding and agreed to proceed.   SUBJECTIVE Chief Complaint  Patient presents with  . Sore Throat    Pt c/o hoarse, sore throat, x 2 wks    HPI: [name] is a 59 y.o. female present for hoarseness w/ sore throat x 2 weeks. She has been taking her allergy medicine and flonase. She reports her symptoms improved over the last 2 weeks, but when she has to speak more, like today, at work she becomes hoarse again. She denies fever, chills, pain with swallowing, PND, cough or shortness of breath.   ROS: See pertinent positives and negatives per HPI.  Patient Active Problem List   Diagnosis Date Noted  . Essential hypertension 09/05/2017  . Chronic pain of left knee 09/05/2017  . Bilateral lower extremity edema 12/20/2011  . 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 GAD (generalized anxiety disorder) 09/09/2011  . GERD 05/14/2009    Social History   Tobacco Use  . Smoking status: Light Tobacco Smoker    Types: Cigarettes  . Smokeless tobacco: Never Used  Substance Use Topics  . Alcohol use: Yes    Comment: occ wine    Current Outpatient Medications:  .  albuterol (PROAIR HFA) 108 (90 Base) MCG/ACT inhaler, Inhale 1-2 puffs every 6 (six) hours as needed into the lungs for wheezing., Disp: 1 Inhaler, Rfl: 1 .  ALPRAZolam (XANAX) 0.5 MG tablet, TAKE 1-2 TABLETS TWICE A DAY AS NEEDED FOR INCREASED ANXIETY, Disp: 60 tablet, Rfl: 5 .  aluminum chloride (DRYSOL) 20 % external solution, Apply  topically as needed., Disp: , Rfl:  .  atorvastatin (LIPITOR) 40 MG tablet, Take 1 tablet (40 mg total) by mouth daily., Disp: 90 tablet, Rfl: 3 .  citalopram (CELEXA) 20 MG tablet, Take 1 tablet (20 mg total) by mouth daily., Disp: 90 tablet, Rfl: 3 .  desloratadine (CLARINEX) 5 MG tablet, Take 1 tablet (5 mg total) by mouth daily., Disp: 90 tablet, Rfl: 3 .  fluticasone (FLONASE) 50 MCG/ACT nasal spray, Place 2 sprays into both nostrils daily., Disp: 48 g, Rfl: 5 .  hydrochlorothiazide (HYDRODIURIL) 25 MG tablet, Take 1 tablet (25 mg total) by mouth daily., Disp: 90 tablet, Rfl: 3 .  lisinopril (ZESTRIL) 20 MG tablet, Take 1 tablet (20 mg total) by mouth daily., Disp: 90 tablet, Rfl: 3 .  metFORMIN (GLUCOPHAGE) 500 MG tablet, Take 1 tablet (500 mg total) by mouth 2 (two) times daily with a meal., Disp: 180 tablet, Rfl: 3 .  methocarbamol (ROBAXIN) 500 MG tablet, Take 500 mg by mouth 3 (three) times daily., Disp: , Rfl:  .  oxyCODONE-acetaminophen (PERCOCET) 10-325 MG tablet, TAKE 1 TABLET BY MOUTH FOUR TIMES A DAY AS NEEDED, Disp: , Rfl: 0  No Known Allergies  OBJECTIVE: There were no vitals taken for this visit. Gen: No acute distress. Nontoxic in appearance. hoarseness present.  HENT: AT. [place].  MMM.  Eyes:Pupils Equal Round Reactive to light, Extraocular movements intact,  Conjunctiva without redness, discharge or icterus. Chest: Cough not present. Skin: no rashes, purpura or petechiae.  Neuro:Alert. Oriented x3  Psych: Normal affect and demeanor. Normal speech. Normal thought content and judgment.  ASSESSMENT AND PLAN: [name] is a 59 y.o. female present for  Laryngitis Rest. Hydrate. Vocal rest when able.  Encouraged sugar free  cough drops/throat lozenge use throughout day to help w/ hoarseness. Abx tx not indicated. Since she has not speak with her job- prescribed prednisone burst F/u PRN  Felix Pacini, DO 04/07/2020   Return in about 2 weeks (around 04/21/2020),  or if symptoms worsen or fail to improve.  No orders of the defined types were placed in this encounter.  No orders of the defined types were placed in this encounter.  Referral Orders  No referral(s) requested today

## 2020-04-07 NOTE — Patient Instructions (Signed)
Laryngitis °Laryngitis is irritation and swelling (inflammation) of your vocal cords. This condition causes symptoms such as: °· A change in your voice. It may sound low and hoarse. °· Loss of voice. °· Coughing. °· Sore throat. °· Dry throat. °· Stuffy nose. °Depending on the cause, this condition may go away after a short time or may last for more than 3 weeks. Treatment often involves resting your voice and using medicines to soothe your throat. °Follow these instructions at home: °Medicines °· Take over-the-counter and prescription medicines only as told by your doctor. °· If you were prescribed an antibiotic medicine, take it as told by your doctor. Do not stop taking it even if you start to feel better. °General instructions °· Talk as little as possible. Also avoid whispering. °· Write instead of talking. Do this until your voice is back to normal. °· Drink enough fluid to keep your pee (urine) pale yellow. °· Breathe in moist air. Use a humidifier if you live in a dry climate. °· Do not use any products that have nicotine or tobacco in them, such as cigarettes and e-cigarettes. If you need help quitting, ask your doctor. °Contact a doctor if: °· You have a fever. °· Your pain is worse. °· Your symptoms do not get better in 2 weeks. °Get help right away if: °· You cough up blood. °· You have trouble swallowing. °· You have trouble breathing. °Summary °· Laryngitis is inflammation of your vocal cords. °· This condition causes your voice to sound low and hoarse. °· Rest your voice by talking as little as possible. Also avoid whispering. °This information is not intended to replace advice given to you by your health care provider. Make sure you discuss any questions you have with your health care provider. °Document Revised: 03/29/2017 Document Reviewed: 03/29/2017 °Elsevier Patient Education © 2020 [employer] ° °

## 2020-04-16 ENCOUNTER — Telehealth: Payer: Self-pay | Admitting: Family Medicine

## 2020-04-16 NOTE — Telephone Encounter (Signed)
Please advise 

## 2020-04-16 NOTE — Telephone Encounter (Signed)
Patient had VV with Dr. Claiborne Billings 04/07/20 and was given prednisone. She is now out of prednisone and has not recovered her voice (sounded very hoarse on the phone with me). Patient is requesting refill of prednisone.

## 2020-04-16 NOTE — Telephone Encounter (Signed)
Sorry, tell her that more prednisone will not bring her voice back any quicker. Needs time!-thx

## 2020-06-14 ENCOUNTER — Other Ambulatory Visit: Payer: Self-pay | Admitting: Family Medicine

## 2020-06-18 ENCOUNTER — Telehealth: Payer: Self-pay | Admitting: Family Medicine

## 2020-06-18 NOTE — Telephone Encounter (Signed)
Noted  

## 2020-06-18 NOTE — Telephone Encounter (Signed)
Patient called back to office to check status of refill request for Alprazolam.  Per Dr. Milinda Cave 30 d/s supply but no further refills until on office follow up.  She agreed and understood. She stated she will call back asap when she knows her work scheduled for March.  I told her she needed not to wait until last minute, meaning dont wait until the day before/day of, when she is needing her meds filled because Dr. Samul Dada schedule is usually booked 2 weeks out, and he will not approve meds to be filled again without seeing her PRIOR to fill date (3/24=30d/s) She understood.

## 2020-06-18 NOTE — Telephone Encounter (Signed)
Pt advised refill sent for 30 d/s. Must have f/u appt before refill runs out and has to be IN PERSON. Pt voiced understanding

## 2020-06-18 NOTE — Telephone Encounter (Signed)
Requesting: alprazolam 0.5mg  Contract: last signed 02/25/19, due to update on 12/06/19 UDS: ordered on 12/05/19, not completed Last Visit:12/05/19 Next Visit: advised to f/u 3 mo in person Last Refill: 12/05/19(60,5)  Please Advise. Medication pending

## 2020-06-18 NOTE — Telephone Encounter (Signed)
Overdue for appropr f/u. I did 30d supply but no further RF's until has f/u in person.-thx

## 2020-07-10 ENCOUNTER — Other Ambulatory Visit: Payer: Self-pay | Admitting: Family Medicine

## 2020-08-26 DIAGNOSIS — M5412 Radiculopathy, cervical region: Secondary | ICD-10-CM | POA: Diagnosis not present

## 2020-08-26 DIAGNOSIS — M5136 Other intervertebral disc degeneration, lumbar region: Secondary | ICD-10-CM | POA: Diagnosis not present

## 2020-09-25 ENCOUNTER — Other Ambulatory Visit: Payer: Self-pay

## 2020-09-28 ENCOUNTER — Ambulatory Visit (INDEPENDENT_AMBULATORY_CARE_PROVIDER_SITE_OTHER): Payer: BC Managed Care – PPO | Admitting: Family Medicine

## 2020-09-28 ENCOUNTER — Encounter: Payer: Self-pay | Admitting: Family Medicine

## 2020-09-28 ENCOUNTER — Other Ambulatory Visit: Payer: Self-pay

## 2020-09-28 VITALS — BP 120/77 | HR 73 | Temp 98.0°F | Resp 16 | Ht 60.25 in | Wt 187.2 lb

## 2020-09-28 DIAGNOSIS — E119 Type 2 diabetes mellitus without complications: Secondary | ICD-10-CM

## 2020-09-28 DIAGNOSIS — Z1159 Encounter for screening for other viral diseases: Secondary | ICD-10-CM | POA: Diagnosis not present

## 2020-09-28 DIAGNOSIS — F411 Generalized anxiety disorder: Secondary | ICD-10-CM

## 2020-09-28 DIAGNOSIS — I10 Essential (primary) hypertension: Secondary | ICD-10-CM | POA: Diagnosis not present

## 2020-09-28 DIAGNOSIS — Z79899 Other long term (current) drug therapy: Secondary | ICD-10-CM

## 2020-09-28 DIAGNOSIS — Z0000 Encounter for general adult medical examination without abnormal findings: Secondary | ICD-10-CM | POA: Diagnosis not present

## 2020-09-28 DIAGNOSIS — Z1211 Encounter for screening for malignant neoplasm of colon: Secondary | ICD-10-CM | POA: Diagnosis not present

## 2020-09-28 DIAGNOSIS — E7800 Pure hypercholesterolemia, unspecified: Secondary | ICD-10-CM

## 2020-09-28 DIAGNOSIS — Z23 Encounter for immunization: Secondary | ICD-10-CM

## 2020-09-28 LAB — CBC WITH DIFFERENTIAL/PLATELET
Basophils Absolute: 0 10*3/uL (ref 0.0–0.1)
Basophils Relative: 0.5 % (ref 0.0–3.0)
Eosinophils Absolute: 0.2 10*3/uL (ref 0.0–0.7)
Eosinophils Relative: 2.3 % (ref 0.0–5.0)
HCT: 43.8 % (ref 36.0–46.0)
Hemoglobin: 14.8 g/dL (ref 12.0–15.0)
Lymphocytes Relative: 25.8 % (ref 12.0–46.0)
Lymphs Abs: 2.5 10*3/uL (ref 0.7–4.0)
MCHC: 33.7 g/dL (ref 30.0–36.0)
MCV: 91 fl (ref 78.0–100.0)
Monocytes Absolute: 0.7 10*3/uL (ref 0.1–1.0)
Monocytes Relative: 6.9 % (ref 3.0–12.0)
Neutro Abs: 6.3 10*3/uL (ref 1.4–7.7)
Neutrophils Relative %: 64.5 % (ref 43.0–77.0)
Platelets: 328 10*3/uL (ref 150.0–400.0)
RBC: 4.81 Mil/uL (ref 3.87–5.11)
RDW: 13.4 % (ref 11.5–15.5)
WBC: 9.8 10*3/uL (ref 4.0–10.5)

## 2020-09-28 LAB — HEMOGLOBIN A1C: Hgb A1c MFr Bld: 7.2 % — ABNORMAL HIGH (ref 4.6–6.5)

## 2020-09-28 LAB — TSH: TSH: 0.53 u[IU]/mL (ref 0.35–4.50)

## 2020-09-28 MED ORDER — AMPHETAMINE-DEXTROAMPHET ER 20 MG PO CP24: ORAL_CAPSULE | ORAL | 0 refills | Status: DC

## 2020-09-28 MED ORDER — LISINOPRIL 20 MG PO TABS: 20.0000 mg | ORAL_TABLET | Freq: Every day | ORAL | 3 refills | Status: DC

## 2020-09-28 MED ORDER — ALPRAZOLAM 0.5 MG PO TABS: ORAL_TABLET | ORAL | 5 refills | Status: DC

## 2020-09-28 MED ORDER — CITALOPRAM HYDROBROMIDE 20 MG PO TABS: 20.0000 mg | ORAL_TABLET | Freq: Every day | ORAL | 3 refills | Status: DC

## 2020-09-28 MED ORDER — METFORMIN HCL 500 MG PO TABS: 500.0000 mg | ORAL_TABLET | Freq: Two times a day (BID) | ORAL | 3 refills | Status: DC

## 2020-09-28 MED ORDER — HYDROCHLOROTHIAZIDE 25 MG PO TABS: 1.0000 | ORAL_TABLET | Freq: Every day | ORAL | 3 refills | Status: DC

## 2020-09-28 NOTE — Progress Notes (Signed)
Office Note 09/28/2020  CC:  Chief Complaint  Patient presents with  . Annual Exam    Pt is fasting    HPI:  [name] is a 60 y.o. White female who is here for annual health maintenance exam and f/u DM 2, HTN, HLD, and GAD (high risk med use). I last saw her 12/05/19. 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 Adderall xr 20mg  qd will be started but will wait on rx'ing until after she comes in for UDS and sign new CSC.  She'll make lab appt, UDS ordered--future.  2) GAD/depression: stable on citalopram 20mg  qd and alpraz 0.5mg  bid. Alpraz 0.5mg , 1 bid prn, #60, RF x 5 eRx'd today.  3) DM 2: noncompliant with meds, diet, and exercise. A1c 7.1%3 d/a. Urine microalb/cr neg. She is going to restart metformin 500 mg bid, try intermittent fasting diet and get more exercise. Repeat a1c 3 mo.  4) HLD; noncompliant with med. Restart atorva 40mg  qd. FLP and hepatic panel repeat 3 mo.  5) HTN: not clear if she has been compliant with these meds or not. Encouraged complete compliance with meds, diet, exercise. Recent lytes/cr stable. Repeat CMET 3 mo."  INTERIM HX: Working from home. Has GYN but has not seen him in a while, owes their office money.  DM: not monitoring glucoses.  HTN: occ home bp check "within normal range" per her recollection.  HLD:  Taking atorva 40mg  qd w/out sx's.  Anxiety/Adult ADD: Says she used to be on stimulant for ADD in school, also in her 51s, 30s. Used to be helpful. Has trouble focusing, staying on task, losing things, poor organization, affects work and relationships at home.      Has not been taking xanax in about 6 wks b/c ran out.   PMP AWARE reviewed today: most recent rx for alprazolam 0.5mg  was filled 06/18/20, # 60, rx by me. No red flags.   Past Medical History:  Diagnosis Date  . Chronic neck pain    DDD/cervical spondylosis--C3-C6.  Mild central canal stenosis.  . Chronic pain syndrome    pain  mgmt by Dr.   . DDD (degenerative disc disease), lumbar    MRI 05/13/16: mild DDD at L3-4, and ruptured disc at L4-5 to the right.  Dr. 38s recommended right L5 selective nerve root block + referral to Dr. 06/20/20 for surgery (as of 06/04/16 f/u).  As of 02/2018-->chr LBP lateralizing to R glut, pain meds are tx.  . Diabetes mellitus without complication (HCC) 01/04/2016   Dx'd with elevated random gluc + HbA1c 6.6%.  . Essential hypertension 09/05/2017   Started HCTZ 25mg  qd 09/05/17  . GAD (generalized anxiety disorder)    with anger control problems  . GERD (gastroesophageal reflux disease)   . History of pneumonia 2010   with hemoptysis; Pulm eval done, f/u CXR 04/01/09 after pneumonia was normal.  . History of rheumatic fever    childhood--she reports no CV or Renal sequelae  . Hyperlipidemia 12/2015   started statin 12/2015  . IBS (irritable bowel syndrome)    Diarrhea predominant  . Impingement syndrome of right shoulder   . Pain in left knee    [facility]; MRI 09/2017--posterior horn medial meniscus tear (radial, not longitudinal).  Conservative rx as of 10/24/17 ortho f/u visit.  . Polyarthralgia 2017   Rheum eval (Dr. 14/08/10) 10/2015: no inflammatory arthritis suspected.  . Tobacco abuse    30+ pack-yr hx  .  Venous insufficiency 01/2012    Past Surgical History:  Procedure Laterality Date  . CHOLECYSTECTOMY  2011  . COLONOSCOPY  approx 2007   Dr. Jarold MottoPatterson (normal per pt--cannot locate path or procedure record)  . ESOPHAGOGASTRODUODENOSCOPY  "   "   Mild esophagogastric junction inflammation, otherwise normal.  . OVARY SURGERY  1993   left (ovarian cyst)  . VAGINAL HYSTERECTOMY  2003   Nonmalignant reasons (DUB)    Family History  Problem Relation Age of Onset  . Alcohol abuse Father   . Heart disease Maternal Grandfather     Social History   Socioeconomic History  . Marital status: Married    Spouse name: Not on file  . Number of children: Not on file   . Years of education: Not on file  . Highest education level: Not on file  Occupational History  . Not on file  Tobacco Use  . Smoking status: Light Tobacco Smoker    Types: Cigarettes  . Smokeless tobacco: Never Used  Vaping Use  . Vaping Use: Never used  Substance and Sexual Activity  . Alcohol use: Yes    Comment: occ wine  . Drug use: No  . Sexual activity: Not Currently  Other Topics Concern  . Not on file  Social History Narrative   Divorced, one 60 y/o son.  Has 3 brothers and 3 sisters--healthy per pt report.   [place]   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   Tobacco: 30 + yrs, 1 ppd.   Rare glass of wine.   No drug use, no exercise.   Has a shitzu, parrot, and a parakeet.  Lives alone in [place].   Social Determinants of Health   Financial Resource Strain: Not on file  Food Insecurity: Not on file  Transportation Needs: Not on file  Physical Activity: Not on file  Stress: Not on file  Social Connections: Not on file  Intimate Partner Violence: Not on file    Outpatient Medications Prior to Visit  Medication Sig Dispense Refill  . atorvastatin (LIPITOR) 40 MG tablet Take 1 tablet (40 mg total) by mouth daily. 90 tablet 3  . desloratadine (CLARINEX) 5 MG tablet Take 1 tablet (5 mg total) by mouth daily. 90 tablet 3  . fluticasone (FLONASE) 50 MCG/ACT nasal spray Place 2 sprays into both nostrils daily. 48 g 5  . metaxalone (SKELAXIN) 800 MG tablet Take 800 mg by mouth 3 (three) times daily as needed.    Marland Kitchen. oxyCODONE-acetaminophen (PERCOCET) 10-325 MG tablet TAKE 1 TABLET BY MOUTH FOUR TIMES A DAY AS NEEDED  0  . ALPRAZolam (XANAX) 0.5 MG tablet TAKE 1-2 TABLETS TWICE A DAY AS NEEDED FOR INCREASED ANXIETY 60 tablet 0  . citalopram (CELEXA) 20 MG tablet TAKE 1 TABLET BY MOUTH EVERY DAY 30 tablet 0  . hydrochlorothiazide (HYDRODIURIL) 25 MG tablet TAKE 1 TABLET BY MOUTH EVERY DAY 14 tablet 0  .  lisinopril (ZESTRIL) 20 MG tablet Take 1 tablet (20 mg total) by mouth daily. 90 tablet 3  . metFORMIN (GLUCOPHAGE) 500 MG tablet Take 1 tablet (500 mg total) by mouth 2 (two) times daily with a meal. 180 tablet 3  . albuterol (PROAIR HFA) 108 (90 Base) MCG/ACT inhaler Inhale 1-2 puffs every 6 (six) hours as needed into the lungs for wheezing. (Patient not taking: Reported on 09/28/2020) 1 Inhaler 1  . aluminum chloride (DRYSOL) 20 % external solution Apply topically as needed. (  Patient not taking: Reported on 09/28/2020)    . methocarbamol (ROBAXIN) 500 MG tablet Take 500 mg by mouth 3 (three) times daily. (Patient not taking: Reported on 09/28/2020)    . predniSONE (DELTASONE) 50 MG tablet Take 1 tablet (50 mg total) by mouth daily with breakfast. (Patient not taking: Reported on 09/28/2020) 4 tablet 0   No facility-administered medications prior to visit.    No Known Allergies  ROS Review of Systems  Constitutional: Negative for appetite change, chills, fatigue and fever.  HENT: Positive for congestion (sneezing). Negative for dental problem, ear pain and sore throat.   Eyes: Negative for discharge, redness and visual disturbance.  Respiratory: Positive for cough and wheezing. Negative for chest tightness and shortness of breath.   Cardiovascular: Negative for chest pain, palpitations and leg swelling.  Gastrointestinal: Negative for abdominal pain, blood in stool, diarrhea, nausea and vomiting.  Genitourinary: Negative for difficulty urinating, dysuria, flank pain, frequency, hematuria and urgency.  Musculoskeletal: Negative for arthralgias, back pain, joint swelling, myalgias and neck stiffness.  Skin: Negative for pallor and rash.  Neurological: Negative for dizziness, speech difficulty, weakness and headaches.  Hematological: Negative for adenopathy. Does not bruise/bleed easily.  Psychiatric/Behavioral: Negative for confusion and sleep disturbance. The patient is not nervous/anxious.      PE; Vitals with BMI 09/28/2020 05/29/2019 02/25/2019  Height 5' 0.25" - 5\' 3"   Weight 187 lbs 3 oz - 179 lbs 13 oz  BMI 36.27 - 31.86  Systolic 120 142  Diastolic 77 86 78  Pulse 73 75 77   Exam chaperoned by 657, CMA. Gen: Alert, well appearing.  Patient is oriented to person, place, time, and situation. AFFECT: pleasant, lucid thought and speech. ENT: Ears: EACs clear, normal epithelium.  TMs with good light reflex and landmarks bilaterally.  Eyes: no injection, icteris, swelling, or exudate.  EOMI, PERRLA. Nose: no drainage or turbinate edema/swelling.  No injection or focal lesion.  Mouth: lips without lesion/swelling.  Oral mucosa pink and moist.  Dentition intact and without obvious caries or gingival swelling.  Oropharynx without erythema, exudate, or swelling.  Neck: supple/nontender.  No LAD, mass, or TM.  Carotid pulses 2+ bilaterally, without bruits. CV: RRR, no m/r/g.   LUNGS: bilat insp rhonchi, mild coarse exp wheeze bilat, nonlabored resps, good aeration in all lung fields. ABD: soft, NT, ND, BS normal.  No hepatospenomegaly or mass.  No bruits. EXT: no clubbing, cyanosis, or edema.  Musculoskeletal: no joint swelling, erythema, warmth, or tenderness.  ROM of all joints intact. Skin - no sores or suspicious lesions or rashes or color changes Foot exam -no swelling, tenderness or skin or vascular lesions. Color and temperature is normal. Sensation is intact. Peripheral pulses are palpable. Toenails are normal.  Pertinent labs:  Lab Results  Component Value Date   TSH 0.61 02/25/2019   Lab Results  Component Value Date   WBC 8.4 12/02/2019   HGB 14.9 12/02/2019   HCT 45.4 12/02/2019   MCV 93.0 12/02/2019   PLT 312.0 12/02/2019   Lab Results  Component Value Date   CREATININE 0.78 12/02/2019   BUN 15 12/02/2019   NA 138 12/02/2019   K 4.8 12/02/2019   CL 101 12/02/2019   CO2 29 12/02/2019   Lab Results  Component Value Date   ALT 27  12/02/2019   AST 21 12/02/2019   ALKPHOS 74 12/02/2019   BILITOT 0.6 12/02/2019   Lab Results  Component Value Date   CHOL 197 12/02/2019  Lab Results  Component Value Date   HDL 41.30 12/02/2019   Lab Results  Component Value Date   LDLCALC 119 (H) 12/02/2019   Lab Results  Component Value Date   TRIG 186.0 (H) 12/02/2019   Lab Results  Component Value Date   CHOLHDL 5 12/02/2019   Lab Results  Component Value Date   HGBA1C 7.1 (H) 12/02/2019   ASSESSMENT AND PLAN:   1) DM: cont metformin 500 mg bid. Hba1c and lytes/cr today. Feet exam normal today.  2) HTN: normal bp here today, no home monitoring. Cont hctz 25mg  qd and lisin 20mg  qd,  Lytes/cr today.  3) HLD: tolerating atorva 40mg  qd. FLP and hepatic panel today.  4) GAD: stable. Cont citalopram 20mg  qd.  RF'd alprazolam 0.5mg , 1 bid prn, #60, rF x 5. UDS today--should NOT show benzo but will show opioid b/c chronic pain med use-> managed by ortho.--  5) Adult ADD: I had planned on starting this after I saw her 11/2019 but she did not come in for the UDS I wanted her to get prior to starting it.  UDS today.  Adderall xr 20mg , 1 tab po qd, #30, no RF--sent today.  Needs f/u 6-8 wks.  6) chronic bronchitis: she is smoking still occasionally.    7) Health maintenance exam: Reviewed age and gender appropriate health maintenance issues (prudent diet, regular exercise, health risks of tobacco and excessive alcohol, use of seatbelts, fire alarms in home, use of sunscreen).  Also reviewed age and gender appropriate health screening as well as vaccine recommendations. Vaccines: Tdap due->not given today b/c we were out of this vaccine.   Prevnar 20 indicated->given today.  Shingrix rx sent to pharmacy per pt request. Labs: fasting HP + Hba1c and Hep c screen. Cervical ca screening: not a candidate due to hx of hysterectomy for benign dx. Breast ca screening: has been getting this through GYN MD Dr.  past due and will make f/u appt with GYN. Colon ca screening: overdue->normal colonoscopy approx 2007 per pt report-->pt defers screening and wants to re-eval this decision in 1 yr.  An After Visit Summary was printed and given to the patient.  FOLLOW UP:  Return for f/u ADD/med 6-8 wks.  Signed:  , MD           09/28/2020

## 2020-09-28 NOTE — Addendum Note (Signed)
Addended by: Emi Holes D on: 09/28/2020 11:46 AM   Modules accepted: Orders

## 2020-09-28 NOTE — Patient Instructions (Signed)
Health Maintenance, Female Adopting a healthy lifestyle and getting preventive care are important in promoting health and wellness. Ask your health care provider about:  The right schedule for you to have regular tests and exams.  Things you can do on your own to prevent diseases and keep yourself healthy. What should I know about diet, weight, and exercise? Eat a healthy diet  Eat a diet that includes plenty of vegetables, fruits, low-fat dairy products, and lean protein.  Do not eat a lot of foods that are high in solid fats, added sugars, or sodium.   Maintain a healthy weight Body mass index (BMI) is used to identify weight problems. It estimates body fat based on height and weight. Your health care provider can help determine your BMI and help you achieve or maintain a healthy weight. Get regular exercise Get regular exercise. This is one of the most important things you can do for your health. Most adults should:  Exercise for at least 150 minutes each week. The exercise should increase your heart rate and make you sweat (moderate-intensity exercise).  Do strengthening exercises at least twice a week. This is in addition to the moderate-intensity exercise.  Spend less time sitting. Even light physical activity can be beneficial. Watch cholesterol and blood lipids Have your blood tested for lipids and cholesterol at 60 years of age, then have this test every 5 years. Have your cholesterol levels checked more often if:  Your lipid or cholesterol levels are high.  You are older than 60 years of age.  You are at high risk for heart disease. What should I know about cancer screening? Depending on your health history and family history, you may need to have cancer screening at various ages. This may include screening for:  Breast cancer.  Cervical cancer.  Colorectal cancer.  Skin cancer.  Lung cancer. What should I know about heart disease, diabetes, and high blood  pressure? Blood pressure and heart disease  High blood pressure causes heart disease and increases the risk of stroke. This is more likely to develop in people who have high blood pressure readings, are of African descent, or are overweight.  Have your blood pressure checked: ? Every 3-5 years if you are 18-39 years of age. ? Every year if you are 40 years old or older. Diabetes Have regular diabetes screenings. This checks your fasting blood sugar level. Have the screening done:  Once every three years after age 40 if you are at a normal weight and have a low risk for diabetes.  More often and at a younger age if you are overweight or have a high risk for diabetes. What should I know about preventing infection? Hepatitis B If you have a higher risk for hepatitis B, you should be screened for this virus. Talk with your health care provider to find out if you are at risk for hepatitis B infection. Hepatitis C Testing is recommended for:  Everyone born from 1945 through 1965.  Anyone with known risk factors for hepatitis C. Sexually transmitted infections (STIs)  Get screened for STIs, including gonorrhea and chlamydia, if: ? You are sexually active and are younger than 60 years of age. ? You are older than 60 years of age and your health care provider tells you that you are at risk for this type of infection. ? Your sexual activity has changed since you were last screened, and you are at increased risk for chlamydia or gonorrhea. Ask your health care provider   if you are at risk.  Ask your health care provider about whether you are at high risk for HIV. Your health care provider may recommend a prescription medicine to help prevent HIV infection. If you choose to take medicine to prevent HIV, you should first get tested for HIV. You should then be tested every 3 months for as long as you are taking the medicine. Pregnancy  If you are about to stop having your period (premenopausal) and  you may become pregnant, seek counseling before you get pregnant.  Take 400 to 800 micrograms (mcg) of folic acid every day if you become pregnant.  Ask for birth control (contraception) if you want to prevent pregnancy. Osteoporosis and menopause Osteoporosis is a disease in which the bones lose minerals and strength with aging. This can result in bone fractures. If you are 65 years old or older, or if you are at risk for osteoporosis and fractures, ask your health care provider if you should:  Be screened for bone loss.  Take a calcium or vitamin D supplement to lower your risk of fractures.  Be given hormone replacement therapy (HRT) to treat symptoms of menopause. Follow these instructions at home: Lifestyle  Do not use any products that contain nicotine or tobacco, such as cigarettes, e-cigarettes, and chewing tobacco. If you need help quitting, ask your health care provider.  Do not use street drugs.  Do not share needles.  Ask your health care provider for help if you need support or information about quitting drugs. Alcohol use  Do not drink alcohol if: ? Your health care provider tells you not to drink. ? You are pregnant, may be pregnant, or are planning to become pregnant.  If you drink alcohol: ? Limit how much you use to 0-1 drink a day. ? Limit intake if you are breastfeeding.  Be aware of how much alcohol is in your drink. In the [place] one drink equals one 12 oz bottle of beer (355 mL), one 5 oz glass of wine (148 mL), or one 1 oz glass of hard liquor (44 mL). General instructions  Schedule regular health, dental, and eye exams.  Stay current with your vaccines.  Tell your health care provider if: ? You often feel depressed. ? You have ever been abused or do not feel safe at home. Summary  Adopting a healthy lifestyle and getting preventive care are important in promoting health and wellness.  Follow your health care provider's instructions about healthy  diet, exercising, and getting tested or screened for diseases.  Follow your health care provider's instructions on monitoring your cholesterol and blood pressure. This information is not intended to replace advice given to you by your health care provider. Make sure you discuss any questions you have with your health care provider. Document Revised: 04/04/2018 Document Reviewed: 04/04/2018 Elsevier Patient Education  2021 Elsevier Inc.  

## 2020-09-29 ENCOUNTER — Telehealth: Payer: Self-pay

## 2020-09-29 LAB — COMPREHENSIVE METABOLIC PANEL
ALT: 17 U/L (ref 0–35)
AST: 16 U/L (ref 0–37)
Albumin: 4.5 g/dL (ref 3.5–5.2)
Alkaline Phosphatase: 73 U/L (ref 39–117)
BUN: 24 mg/dL — ABNORMAL HIGH (ref 6–23)
CO2: 29 mEq/L (ref 19–32)
Calcium: 9.5 mg/dL (ref 8.4–10.5)
Chloride: 94 mEq/L — ABNORMAL LOW (ref 96–112)
Creatinine, Ser: 0.68 mg/dL (ref 0.40–1.20)
GFR: 95.25 mL/min (ref >60.00)
Glucose, Bld: 121 mg/dL — ABNORMAL HIGH (ref 70–99)
Potassium: 4 mEq/L (ref 3.5–5.1)
Sodium: 136 mEq/L (ref 135–145)
Total Bilirubin: 0.3 mg/dL (ref 0.2–1.2)
Total Protein: 7.7 g/dL (ref 6.0–8.3)

## 2020-09-29 LAB — LIPID PANEL
Cholesterol: 130 mg/dL (ref 0–200)
HDL: 46 mg/dL (ref >39.00)
LDL Cholesterol: 64 mg/dL (ref 0–99)
NonHDL: 83.61
Total CHOL/HDL Ratio: 3
Triglycerides: 100 mg/dL (ref 0.0–149.0)
VLDL: 20 mg/dL (ref 0.0–40.0)

## 2020-09-29 MED ORDER — METFORMIN HCL 500 MG PO TABS: ORAL_TABLET | ORAL | 5 refills | Status: DC

## 2020-09-29 NOTE — Telephone Encounter (Signed)
-----   Message from Jeoffrey Massed, MD sent at 09/29/2020 12:13 PM EDT ----- All labs normal except Hba1c 7.2%. Goal is <7%.  Her a1c has slowly crept up over the last 1 yr. I recommend she increase metformin to TWO of the 500 mg tabs at supper and continue one tab in the morning. Pls do eRx for metformin 500 mg, 1 tab po qAM and 2 tabs po qhs, #270, RF x 5.-thx

## 2020-09-29 NOTE — Telephone Encounter (Signed)
Spoke with pt regarding labs and instructions.   

## 2020-09-30 ENCOUNTER — Encounter: Payer: Self-pay | Admitting: Family Medicine

## 2020-09-30 NOTE — Telephone Encounter (Signed)
No pictures attached but please advise.

## 2020-09-30 NOTE — Telephone Encounter (Signed)
Pls tell her that no pictures came through. I doubt this is coming from her pneumonia vaccine. Recommend benadryl 25mg  every 6 hours as needed for itching/hives. Try again to send pics.

## 2020-10-01 ENCOUNTER — Encounter: Payer: Self-pay | Admitting: Family Medicine

## 2020-10-01 ENCOUNTER — Telehealth: Payer: Self-pay

## 2020-10-01 NOTE — Telephone Encounter (Signed)
Please see image below as FYI

## 2020-10-01 NOTE — Telephone Encounter (Signed)
Could be from a mite called scabies. Does anyone who is close to her have similar spots?  Has she slept in a different bed or worn someone else's clothes any lately where she could have come into contact with something? I think it is worth trying the topical treatment for scabies.  Let me know if she wants to try it.

## 2020-10-01 NOTE — Telephone Encounter (Signed)
Pls send in rx for any type/brand of glucometer with supplies, Check glucose once daily, #30 strips, Rf x 11. -thx

## 2020-10-01 NOTE — Telephone Encounter (Signed)
From looking at the photos it does not appear to be anything dangerous to worry about (but I can't explain what it is).  If not any better if 4-5d then come in for a check. In the meantime take benadryl for itching and you can try applying otc hydrocortisone cream to the spots.

## 2020-10-01 NOTE — Telephone Encounter (Signed)
No prior rx seen in chart for this. Please Advise

## 2020-10-01 NOTE — Telephone Encounter (Signed)
PA sent via covermymed on 10/01/20   Key: [id number]    Medication:  Adderall XR 20MG  er capsule  Dx: Adult ADD, F98.8   Per Dr. 01-14-1987 pt has tried and failed n/a   Waiting for response.    Medication coverage approved : 10/01/2020 - 10/01/2023   Pt has been notified, will follow up with pharmacy.

## 2020-10-02 ENCOUNTER — Other Ambulatory Visit: Payer: Self-pay

## 2020-10-02 DIAGNOSIS — E119 Type 2 diabetes mellitus without complications: Secondary | ICD-10-CM

## 2020-10-02 LAB — DRUG MONITORING, PANEL 8 WITH CONFIRMATION, URINE
6 Acetylmorphine: NEGATIVE ng/mL (ref <10)
Alcohol Metabolites: NEGATIVE ng/mL
Amphetamines: NEGATIVE ng/mL (ref <500)
Benzodiazepines: NEGATIVE ng/mL (ref <100)
Buprenorphine, Urine: POSITIVE ng/mL — AB (ref <5)
Buprenorphine: 19.1 ng/mL — ABNORMAL HIGH (ref <2)
Cocaine Metabolite: NEGATIVE ng/mL (ref <150)
Codeine: NEGATIVE ng/mL (ref <50)
Creatinine: 67.9 mg/dL
Hydrocodone: NEGATIVE ng/mL (ref <50)
Hydromorphone: NEGATIVE ng/mL (ref <50)
MDMA: NEGATIVE ng/mL (ref <500)
Marijuana Metabolite: NEGATIVE ng/mL (ref <20)
Morphine: NEGATIVE ng/mL (ref <50)
Naloxone: NEGATIVE ng/mL (ref <2)
Norbuprenorphine: 35.4 ng/mL — ABNORMAL HIGH (ref <2)
Norhydrocodone: NEGATIVE ng/mL (ref <50)
Noroxycodone: >10000 ng/mL — ABNORMAL HIGH (ref <50)
Opiates: NEGATIVE ng/mL (ref <100)
Oxidant: NEGATIVE ug/mL
Oxycodone: 8081 ng/mL — ABNORMAL HIGH (ref <50)
Oxycodone: POSITIVE ng/mL — AB (ref <100)
Oxymorphone: 7784 ng/mL — ABNORMAL HIGH (ref <50)
pH: 4.8 (ref 4.5–9.0)

## 2020-10-02 LAB — DM TEMPLATE

## 2020-10-02 LAB — HEPATITIS C ANTIBODY
Hepatitis C Ab: NONREACTIVE
SIGNAL TO CUT-OFF: 0 (ref <1.00)

## 2020-10-02 MED ORDER — PERMETHRIN 5 % EX CREA: TOPICAL_CREAM | CUTANEOUS | 0 refills | Status: DC

## 2020-10-02 MED ORDER — BLOOD GLUCOSE MONITOR KIT: PACK | 0 refills | Status: AC

## 2020-10-02 NOTE — Telephone Encounter (Signed)
Please advise 

## 2020-10-02 NOTE — Telephone Encounter (Signed)
Elimite cream eRx'd. Please reassure patient that scabies (if that is what she has) is NOT from being unclean.

## 2020-10-30 ENCOUNTER — Encounter: Payer: Self-pay | Admitting: Family Medicine

## 2020-10-30 ENCOUNTER — Other Ambulatory Visit: Payer: Self-pay | Admitting: Family Medicine

## 2020-10-30 MED ORDER — AMPHETAMINE-DEXTROAMPHET ER 20 MG PO CP24: ORAL_CAPSULE | ORAL | 0 refills | Status: DC

## 2020-10-30 NOTE — Telephone Encounter (Signed)
Requesting: Adderall Contract: 12/06/19 UDS: 09/28/20 Last Visit: 09/28/20 Next Visit: 11/16/20 Last Refill: 09/28/20 (60,0)  Please Advise. Medication pending

## 2020-11-16 ENCOUNTER — Other Ambulatory Visit: Payer: Self-pay

## 2020-11-16 ENCOUNTER — Encounter: Payer: Self-pay | Admitting: Family Medicine

## 2020-11-16 ENCOUNTER — Ambulatory Visit: Payer: BC Managed Care – PPO | Admitting: Family Medicine

## 2020-11-16 VITALS — BP 114/74 | HR 77 | Temp 97.6°F | Resp 16 | Ht 60.25 in | Wt 180.4 lb

## 2020-11-16 DIAGNOSIS — E119 Type 2 diabetes mellitus without complications: Secondary | ICD-10-CM

## 2020-11-16 DIAGNOSIS — F988 Other specified behavioral and emotional disorders with onset usually occurring in childhood and adolescence: Secondary | ICD-10-CM

## 2020-11-16 MED ORDER — AMPHETAMINE-DEXTROAMPHET ER 20 MG PO CP24: ORAL_CAPSULE | ORAL | 0 refills | Status: DC

## 2020-11-16 NOTE — Progress Notes (Signed)
OFFICE VISIT  11/16/2020  CC:  Chief Complaint  Patient presents with   Follow-up    ADD, medication   HPI:    Patient is a 60 y.o. Caucasian female who presents for 6 wk f/u adult ADD. A/P as of last visit: "1) DM: cont metformin 500 mg bid. Hba1c and lytes/cr today. Feet exam normal today.   2) HTN: normal bp here today, no home monitoring. Cont hctz 8m qd and lisin 24mqd,  Lytes/cr today.   3) HLD: tolerating atorva 4024md. FLP and hepatic panel today.   4) GAD: stable. Cont citalopram 38m66m.  RF'd alprazolam 0.5mg,26mbid prn, #60, rF x 5. UDS today--should NOT show benzo but will show opioid b/c chronic pain med use-> managed by ortho.--   5) Adult ADD: I had planned on starting this after I saw her 11/2019 but she did not come in for the UDS I wanted her to get prior to starting it.  UDS today.  Adderall xr 38mg,58mab po qd, #30, no RF--sent today.  Needs f/u 6-8 wks.   6) chronic bronchitis: she is smoking still occasionally.     7) Health maintenance exam: Reviewed age and gender appropriate health maintenance issues (prudent diet, regular exercise, health risks of tobacco and excessive alcohol, use of seatbelts, fire alarms in home, use of sunscreen).  Also reviewed age and gender appropriate health screening as well as vaccine recommendations. Vaccines: Tdap due->not given today b/c we were out of this vaccine.   Prevnar 20 indicated->given today.  Shingrix rx sent to pharmacy per pt request. Labs: fasting HP + Hba1c and Hep c screen. Cervical ca screening: not a candidate due to hx of hysterectomy for benign dx. Breast ca screening: has been getting this through GYN MD Dr. TombliCoralie Keensdue and will make f/u appt with GYN. Colon ca screening: overdue->normal colonoscopy approx 2007 per pt report-->pt defers screening and wants to re-eval this decision in 1 yr."  INTERIM HX: Labs last visit all good except a1c up to 7.2%.  UDS with appropriate  results.  Pt states all is going well with the med at current dosing---taking a 38mg x80mb twice per day: much improved focus, concentration, task completion.  Less frustration, better multitasking, less impulsivity and restlessness.  Mood is stable. No side effects from the medication.   Hba1c six weeks ago 7.2% so I increased her metformin to 500 qAM and 1000 qPM. Not compliant with this regimen, takes once daily most days, sometimes forgets. No side effects from this med.  Got a glucometer since last visit but has not been using much.  One day she was trying out her new machine and checked many random glucoses and they were 130s-190s. Trying to do better diet, some slim fast shakes.  PMP AWARE reviewed today: most recent rx for adderall xr 38mg wa61mlled 10/30/20, # 60, rx [id number].  Prior to that it was filled 09/30/20, #60, rx by me. No red flags.   Past Medical History:  Diagnosis Date   Chronic neck pain    DDD/cervical spondylosis--C3-C6.  Mild central canal stenosis.   Chronic pain syndrome    pain mgmt by Dr. Ramos   Nelva Bush(degenerative disc disease), lumbar    MRI 05/13/16: mild DDD at L3-4, and ruptured disc at L4-5 to the right.  Dr. Ramos reNelva Bushnded right L5 selective nerve root block + referral to Dr. Gioffre Gladstone Lightergery (as of 06/04/16 f/u).  As of 02/2018-->chr LBP lateralizing to R glut, pain meds are tx.   Diabetes mellitus without complication ([place]) 04/13/7587   Dx'd with elevated random gluc + HbA1c 6.6%.   Essential hypertension 09/05/2017   Started HCTZ 28m qd 09/05/17   GAD (generalized anxiety disorder)    with anger control problems   GERD (gastroesophageal reflux disease)    History of pneumonia 2010   with hemoptysis; Pulm eval done, f/u CXR 04/01/09 after pneumonia was normal.   History of rheumatic fever    childhood--she reports no CV or Renal sequelae   Hyperlipidemia 12/2015   started statin 12/2015   IBS (irritable bowel syndrome)    Diarrhea  predominant   Impingement syndrome of right shoulder    Pain in left knee    [facility]; MRI 09/2017--posterior horn medial meniscus tear (radial, not longitudinal).  Conservative rx as of 10/24/17 ortho f/u visit.   Polyarthralgia 2017   Rheum eval (Dr. HTrudie Reed 10/2015: no inflammatory arthritis suspected.   Tobacco abuse    30+ pack-yr hx   Venous insufficiency 01/2012    Past Surgical History:  Procedure Laterality Date   CHOLECYSTECTOMY  2011   COLONOSCOPY  approx 2007   Dr. PSharlett Iles(normal per pt--cannot locate path or procedure record)   ESOPHAGOGASTRODUODENOSCOPY  "   "   Mild esophagogastric junction inflammation, otherwise normal.   OBrutus  left (ovarian cyst)   VAGINAL HYSTERECTOMY  2003   Nonmalignant reasons (DUB)    Outpatient Medications Prior to Visit  Medication Sig Dispense Refill   ALPRAZolam (XANAX) 0.5 MG tablet 1 tab po bid prn anxiety 60 tablet 5   atorvastatin (LIPITOR) 40 MG tablet Take 1 tablet (40 mg total) by mouth daily. 90 tablet 3   blood glucose meter kit and supplies KIT Dispense based on patient and insurance preference. Check glucose once daily 1 each 0   citalopram (CELEXA) 20 MG tablet Take 1 tablet (20 mg total) by mouth daily. 90 tablet 3   desloratadine (CLARINEX) 5 MG tablet Take 1 tablet (5 mg total) by mouth daily. 90 tablet 3   fluticasone (FLONASE) 50 MCG/ACT nasal spray SPRAY 2 SPRAYS INTO EACH NOSTRIL EVERY DAY 48 mL 1   hydrochlorothiazide (HYDRODIURIL) 25 MG tablet Take 1 tablet (25 mg total) by mouth daily. 90 tablet 3   lisinopril (ZESTRIL) 20 MG tablet Take 1 tablet (20 mg total) by mouth daily. 90 tablet 3   metaxalone (SKELAXIN) 800 MG tablet Take 800 mg by mouth 3 (three) times daily as needed.     metFORMIN (GLUCOPHAGE) 500 MG tablet 1  tab po qAM and 2 tabs po qhs 270 tablet 5   oxyCODONE-acetaminophen (PERCOCET) 10-325 MG tablet TAKE 1 TABLET BY MOUTH FOUR TIMES A DAY AS NEEDED  0   permethrin (ELIMITE) 5 %  cream Apply 1/2 tube from top of head to soles of feet.  Rinse off after 8-10 hours.  Repeat in 1 week. 60 g 0   amphetamine-dextroamphetamine (ADDERALL XR) 20 MG 24 hr capsule 1 tab po bid for attention deficit disorder 60 capsule 0   albuterol (PROAIR HFA) 108 (90 Base) MCG/ACT inhaler Inhale 1-2 puffs every 6 (six) hours as needed into the lungs for wheezing. (Patient not taking: No sig reported) 1 Inhaler 1   aluminum chloride (DRYSOL) 20 % external solution Apply topically as needed. (Patient not taking: No sig reported)     No facility-administered medications prior to visit.  No Known Allergies  ROS As per HPI  PE: Vitals with BMI 11/16/2020 09/28/2020 05/29/2019  Height 5' 0.25" 5' 0.25" -  Weight 180 lbs 6 oz 187 lbs 3 oz -  BMI 44.61 90.12 -  Systolic 224 114 643  Diastolic 74 77 86  Pulse 77 73 75   Wt Readings from Last 2 Encounters:  11/16/20 180 lb 6.4 oz (81.8 kg)  09/28/20 187 lb 3.2 oz (84.9 kg)    Gen: alert, oriented x 4, affect pleasant.  Lucid thinking and conversation noted. No further exam today.   LABS:  Lab Results  Component Value Date   TSH 0.53 09/28/2020   Lab Results  Component Value Date   WBC 9.8 09/28/2020   HGB 14.8 09/28/2020   HCT 43.8 09/28/2020   MCV 91.0 09/28/2020   PLT 328.0 09/28/2020   Lab Results  Component Value Date   CREATININE 0.68 09/28/2020   BUN 24 (H) 09/28/2020   NA 136 09/28/2020   K 4.0 09/28/2020   CL 94 (L) 09/28/2020   CO2 29 09/28/2020   Lab Results  Component Value Date   ALT 17 09/28/2020   AST 16 09/28/2020   ALKPHOS 73 09/28/2020   BILITOT 0.3 09/28/2020   Lab Results  Component Value Date   CHOL 130 09/28/2020   Lab Results  Component Value Date   HDL 46.00 09/28/2020   Lab Results  Component Value Date   LDLCALC 64 09/28/2020   Lab Results  Component Value Date   TRIG 100.0 09/28/2020   Lab Results  Component Value Date   CHOLHDL 3 09/28/2020   Lab Results  Component Value  Date   HGBA1C 7.2 (H) 09/28/2020    IMPRESSION AND PLAN:  1) Adult ADD: she gets best benefit by taking her adderall xr 32m morning and late afternoon, w/out adverse side effect. I did electronic rx's for adderall xr 230m 1 bid, #60 today for Aug, Sept, and Oct.  Appropriate fill on/after date was noted on each rx.  2) DM 2. Some noncompliance with her metformin.  Diet improving some, has lost 7 lbs in the last 6 wks. Encouraged her to take 500 mg metformin qAM and 100018mPM. Monitor gluc fasting or 2H PP a few days a week. Urine microalb/cr today. Next a1c after 12/29/20.  An After Visit Summary was printed and given to the patient.  FOLLOW UP: Return in about 3 months (around 02/16/2021) for routine chronic illness f/u.  Signed:  PhiCrissie SicklesD           11/16/2020

## 2020-11-17 LAB — MICROALBUMIN / CREATININE URINE RATIO
Creatinine,U: 128.7 mg/dL
Microalb Creat Ratio: 0.7 mg/g (ref 0.0–30.0)
Microalb, Ur: 1 mg/dL (ref 0.0–1.9)

## 2020-12-18 ENCOUNTER — Other Ambulatory Visit: Payer: Self-pay

## 2020-12-18 MED ORDER — GLUCOSE BLOOD VI STRP: ORAL_STRIP | 12 refills | Status: AC

## 2020-12-18 MED ORDER — ACCU-CHEK SOFTCLIX LANCETS MISC: 12 refills | Status: AC

## 2020-12-18 NOTE — Progress Notes (Signed)
Received rx request from pharmacy for test strips and softclix lancets for glucometer. Rx sent for both

## 2020-12-30 ENCOUNTER — Other Ambulatory Visit: Payer: Self-pay

## 2020-12-30 MED ORDER — AMPHETAMINE-DEXTROAMPHET ER 20 MG PO CP24: ORAL_CAPSULE | ORAL | 0 refills | Status: DC

## 2020-12-30 NOTE — Telephone Encounter (Signed)
Confirmed pharmacy got Rx

## 2020-12-30 NOTE — Telephone Encounter (Signed)
Pharmacy attempted to fill medication while I was on the phone with them and all of the Rx sent on 11/16/20 will not go though. This will require a new Rx to be sent to the pharmacy.

## 2020-12-30 NOTE — Telephone Encounter (Signed)
OK, rx's for adderall for this month and next month eRx'd

## 2020-12-30 NOTE — Telephone Encounter (Signed)
Spoke with patient and notified her that she will need to call pharmacy to have Rx filled. Sent 11/16/20 with a fill date for 12/29/20.

## 2020-12-30 NOTE — Telephone Encounter (Signed)
Patient refill request.  amphetamine-dextroamphetamine (ADDERALL XR) 20 MG 24 hr capsule [[id number]]   [id number] [place] [place] - [address]

## 2021-01-29 ENCOUNTER — Other Ambulatory Visit: Payer: Self-pay

## 2021-01-29 ENCOUNTER — Encounter: Payer: Self-pay | Admitting: Family Medicine

## 2021-01-29 ENCOUNTER — Other Ambulatory Visit: Payer: Self-pay | Admitting: Family Medicine

## 2021-01-29 MED ORDER — DESLORATADINE 5 MG PO TABS: 5.0000 mg | ORAL_TABLET | Freq: Every day | ORAL | 3 refills | Status: DC

## 2021-03-08 ENCOUNTER — Telehealth: Payer: Self-pay | Admitting: Family Medicine

## 2021-03-08 DIAGNOSIS — G894 Chronic pain syndrome: Secondary | ICD-10-CM | POA: Diagnosis not present
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
## 2021-03-09 MED ORDER — AMPHETAMINE-DEXTROAMPHET ER 20 MG PO CP24: ORAL_CAPSULE | ORAL | 0 refills | Status: DC

## 2021-03-09 NOTE — Telephone Encounter (Signed)
OK rx sent ?

## 2021-03-09 NOTE — Telephone Encounter (Signed)
Requesting: adderall Contract: 12/06/19 UDS: 09/28/20 Last Visit:11/16/20 Next Visit: advised to f/u Oct Last Refill:12/30/20(60,0) electronic RF for Oct  Please Advise

## 2021-03-09 NOTE — Telephone Encounter (Signed)
Pt advised rx sent to preferred pharmacy.

## 2021-04-05 ENCOUNTER — Other Ambulatory Visit: Payer: Self-pay | Admitting: Family Medicine

## 2021-04-05 NOTE — Telephone Encounter (Signed)
Requesting: alprazolam Contract:8/1.3/21 UDS: 09/28/20 Last Visit:11/16/20 Next Visit: advised to f/u 3 months Last Refill: 09/28/20(60,5)  Please Advise. Medication pending

## 2021-04-07 ENCOUNTER — Other Ambulatory Visit: Payer: Self-pay

## 2021-04-07 NOTE — Telephone Encounter (Signed)
Patient refill request.  [facility] [address]  amphetamine-dextroamphetamine (ADDERALL XR) 20 MG 24 hr capsule [[id number]]

## 2021-04-08 MED ORDER — AMPHETAMINE-DEXTROAMPHET ER 20 MG PO CP24: ORAL_CAPSULE | ORAL | 0 refills | Status: DC

## 2021-04-08 NOTE — Telephone Encounter (Signed)
OK, adderall rx'd

## 2021-04-08 NOTE — Telephone Encounter (Signed)
Tried calling patient, unable to LVM regarding refill

## 2021-04-08 NOTE — Telephone Encounter (Signed)
Requesting: adderall Contract: 12/06/19 UDS: 09/28/20 Last Visit:11/16/20 Next Visit: advised to f/u 3 months Last Refill: 03/09/21(60,0)  Please Advise. Med pending

## 2021-05-12 ENCOUNTER — Other Ambulatory Visit: Payer: Self-pay | Admitting: Family Medicine

## 2021-05-12 MED ORDER — AMPHETAMINE-DEXTROAMPHET ER 20 MG PO CP24: ORAL_CAPSULE | ORAL | 0 refills | Status: DC

## 2021-05-12 MED ORDER — ALPRAZOLAM 0.5 MG PO TABS: ORAL_TABLET | ORAL | 5 refills | Status: DC

## 2021-05-12 NOTE — Telephone Encounter (Signed)
Pt needing refill medications  amphetamine-dextroamphetamine amphetamine-dextroamphetamine (ADDERALL XR) 20 MG 24 hr capsule  ALPRAZolam ALPRAZolam (XANAX) 0.5 MG tablet  [id number] [place], [place] - [address] Phone:  [phone]  Fax:  [phone]

## 2021-05-12 NOTE — Telephone Encounter (Signed)
Requesting: adderall Contract: 12/06/19 UDS: 09/28/20 Last Visit:11/16/20 Next Visit: 06/11/21 Last Refill:  Requesting: alprazolam Contract: 12/06/19 UDS: 09/28/20 Last Visit: 11/16/20 Next Visit:2/05/01/21 Last Refill: 04/05/21(60,5)  Please Advise. Meds pending

## 2021-05-13 NOTE — Telephone Encounter (Signed)
Pt advised refill sent. °

## 2021-05-28 ENCOUNTER — Telehealth: Payer: Self-pay | Admitting: Family Medicine

## 2021-05-28 NOTE — Telephone Encounter (Signed)
[facility], med is not in stock. [facility] in [place] will not have med in stock until mid next week. Pt advised of update and to contact pharmacy regarding rx being filled.

## 2021-05-28 NOTE — Telephone Encounter (Signed)
Pt called said there is a shortage on medication  amphetamine-dextroamphetamine amphetamine-dextroamphetamine (ADDERALL XR) 20 MG 24 hr capsule  Following pharmacies is out of stock of medication: [facility] [place].  Pt doesn't know what to do that Dr. Milinda Cave can recommend?.--KR   Pt cell: [phone]

## 2021-05-28 NOTE — Telephone Encounter (Signed)
Pt advised to contact insurance to find out what is on her formulary that they will cover. She will call us back.

## 2021-06-02 ENCOUNTER — Other Ambulatory Visit: Payer: Self-pay | Admitting: Family Medicine

## 2021-06-10 ENCOUNTER — Other Ambulatory Visit: Payer: Self-pay

## 2021-06-11 ENCOUNTER — Ambulatory Visit: Payer: BC Managed Care – PPO | Admitting: Family Medicine

## 2021-06-11 ENCOUNTER — Encounter: Payer: Self-pay | Admitting: Family Medicine

## 2021-06-11 ENCOUNTER — Other Ambulatory Visit: Payer: Self-pay

## 2021-06-11 VITALS — BP 139/82 | HR 99 | Temp 97.9°F | Ht 60.25 in | Wt 200.0 lb

## 2021-06-11 DIAGNOSIS — Z79899 Other long term (current) drug therapy: Secondary | ICD-10-CM

## 2021-06-11 DIAGNOSIS — E119 Type 2 diabetes mellitus without complications: Secondary | ICD-10-CM | POA: Diagnosis not present

## 2021-06-11 DIAGNOSIS — F988 Other specified behavioral and emotional disorders with onset usually occurring in childhood and adolescence: Secondary | ICD-10-CM

## 2021-06-11 DIAGNOSIS — E7800 Pure hypercholesterolemia, unspecified: Secondary | ICD-10-CM

## 2021-06-11 DIAGNOSIS — I10 Essential (primary) hypertension: Secondary | ICD-10-CM

## 2021-06-11 DIAGNOSIS — F411 Generalized anxiety disorder: Secondary | ICD-10-CM

## 2021-06-11 MED ORDER — AMPHETAMINE-DEXTROAMPHETAMINE 20 MG PO TABS: 20.0000 mg | ORAL_TABLET | Freq: Two times a day (BID) | ORAL | 0 refills | Status: DC

## 2021-06-11 NOTE — Progress Notes (Signed)
OFFICE VISIT  06/11/2021  CC:  Chief Complaint  Patient presents with   Follow-up    RCI, pt is not fasting    Patient is a 61 y.o. female who presents for f/u DM, HTN, GAD, adult ADD (high risk med use). I last saw her 11/16/20. A/P as of that visit:  "1) Adult ADD: she gets best benefit by taking her adderall xr 45m morning and late afternoon, w/out adverse side effect. I did electronic rx's for adderall xr 296m 1 bid, #60 today for Aug, Sept, and Oct.  Appropriate fill on/after date was noted on each rx.   2) DM 2. Some noncompliance with her metformin.  Diet improving some, has lost 7 lbs in the last 6 wks. Encouraged her to take 500 mg metformin qAM and 100055mPM. Monitor gluc fasting or 2H PP a few days a week. Urine microalb/cr today. Next a1c after 12/29/20."  INTERIM HX: Urine drug screen showed appropriate results at last visit.  DM: metformin causes GI s/e's so she doesn't take it regularly. No home glucose monitoring. Diet not good b/c taking care of chronically ill mother and "trying to fatten her up". Not fasting today.  Occasional home blood pressure monitoring but she does not recall any numbers.  Anxiety level pretty stable considering she is taking care of her chronically ill mother with dementia. [place]. PMP AWARE reviewed today: most recent rx for alprazolam was filled 05/21/21, # 60,70x by me. Most recent adderall xr 29m16m filled 04/08/22, #60, rx by me. No red flags.  ROS as above, plus--> no fevers, no CP, no SOB, no wheezing, no cough, no dizziness, no HAs, no rashes, no melena/hematochezia.  No polyuria or polydipsia.    No focal weakness, paresthesias, or tremors.  No acute vision or hearing abnormalities.  No dysuria or unusual/new urinary urgency or frequency.  No recent changes in lower legs. No n/v/d or abd pain.  No palpitations.    Past Medical History:   Diagnosis Date   Chronic neck pain    DDD/cervical spondylosis--C3-C6.  Mild central canal stenosis.   Chronic pain syndrome    pain mgmt by Dr. RamoNelva BushDDD (degenerative disc disease), lumbar    MRI 05/13/16: mild DDD at L3-4, and ruptured disc at L4-5 to the right.  Dr. RamoNelva Bushommended right L5 selective nerve root block + referral to Dr. GiofGladstone Lighter surgery (as of 06/04/16 f/u).  As of 02/2018-->chr LBP lateralizing to R glut, pain meds are tx.   Diabetes mellitus without complication ([facility])[place]/186/76/7209x'd with elevated random gluc + HbA1c 6.6%.   Essential hypertension 09/05/2017   Started HCTZ 25mg69m5/14/19   GAD (generalized anxiety disorder)    with anger control problems   GERD (gastroesophageal reflux disease)    History of pneumonia 2010   with hemoptysis; Pulm eval done, f/u CXR 04/01/09 after pneumonia was normal.   History of rheumatic fever    childhood--she reports no CV or Renal sequelae   Hyperlipidemia 12/2015   started statin 12/2015   IBS (irritable bowel syndrome)    Diarrhea predominant   Impingement syndrome of right shoulder    Pain in left knee    [facility]; MRI 09/2017--posterior horn medial meniscus tear (radial, not longitudinal).  Conservative rx as of 10/24/17 ortho f/u visit.   Polyarthralgia 2017   Rheum eval (Dr. HawkeTrudie Reed017: no  inflammatory arthritis suspected.   Tobacco abuse    30+ pack-yr hx   Venous insufficiency 01/2012    Past Surgical History:  Procedure Laterality Date   CHOLECYSTECTOMY  2011   COLONOSCOPY  approx 2007   Dr. Sharlett Iles (normal per pt--cannot locate path or procedure record)   ESOPHAGOGASTRODUODENOSCOPY  "   "   Mild esophagogastric junction inflammation, otherwise normal.   [place]   left (ovarian cyst)   VAGINAL HYSTERECTOMY  2003   Nonmalignant reasons (DUB)    Outpatient Medications Prior to Visit  Medication Sig Dispense Refill   Accu-Chek Softclix Lancets lancets USE TO CHECK GLUCOSE ONCE  DAILY 100 each 12   ALPRAZolam (XANAX) 0.5 MG tablet TAKE 1 TABLET BY MOUTH TWICE A DAY AS NEEDED FOR ANXIETY 60 tablet 5   amphetamine-dextroamphetamine (ADDERALL XR) 20 MG 24 hr capsule 1 tab po bid for attention deficit disorder 60 capsule 0   atorvastatin (LIPITOR) 40 MG tablet Take 1 tablet (40 mg total) by mouth daily. 90 tablet 3   blood glucose meter kit and supplies KIT Dispense based on patient and insurance preference. Check glucose once daily 1 each 0   citalopram (CELEXA) 20 MG tablet Take 1 tablet (20 mg total) by mouth daily. 90 tablet 3   desloratadine (CLARINEX) 5 MG tablet Take 1 tablet (5 mg total) by mouth daily. 90 tablet 3   fluticasone (FLONASE) 50 MCG/ACT nasal spray SPRAY 2 SPRAYS INTO EACH NOSTRIL EVERY DAY 48 mL 1   gabapentin (NEURONTIN) 300 MG capsule Take 300 mg by mouth 3 (three) times daily.     glucose blood test strip USE TO CHECK GLUCOSE ONCE DAILY 100 each 12   hydrochlorothiazide (HYDRODIURIL) 25 MG tablet Take 1 tablet (25 mg total) by mouth daily. 90 tablet 3   lisinopril (ZESTRIL) 20 MG tablet Take 1 tablet (20 mg total) by mouth daily. 90 tablet 3   metaxalone (SKELAXIN) 800 MG tablet Take 800 mg by mouth 3 (three) times daily as needed.     oxyCODONE-acetaminophen (PERCOCET) 10-325 MG tablet TAKE 1 TABLET BY MOUTH FOUR TIMES A DAY AS NEEDED  0   metFORMIN (GLUCOPHAGE) 500 MG tablet 1  tab po qAM and 2 tabs po qhs 270 tablet 5   albuterol (PROAIR HFA) 108 (90 Base) MCG/ACT inhaler Inhale 1-2 puffs every 6 (six) hours as needed into the lungs for wheezing. (Patient not taking: Reported on 09/28/2020) 1 Inhaler 1   aluminum chloride (DRYSOL) 20 % external solution Apply topically as needed. (Patient not taking: Reported on 09/28/2020)     permethrin (ELIMITE) 5 % cream Apply 1/2 tube from top of head to soles of feet.  Rinse off after 8-10 hours.  Repeat in 1 week. (Patient not taking: Reported on 06/11/2021) 60 g 0   No facility-administered medications prior to  visit.    No Known Allergies  ROS As per HPI  PE: Vitals with BMI 06/11/2021 11/16/2020 09/28/2020  Height 5' 0.25" 5' 0.25" 5' 0.25"  Weight 200 lbs 180 lbs 6 oz 187 lbs 3 oz  BMI 38.75 81.01 75.10  Systolic 258 527 782  Diastolic 82 74 77  Pulse 99 77 73     Physical Exam  Gen: Alert, well appearing.  Patient is oriented to person, place, time, and situation. AFFECT: pleasant, lucid thought and speech. CV: RRR, no m/r/g.   LUNGS: CTA bilat, nonlabored resps, good aeration in all lung fields. EXT: no clubbing or cyanosis.  no edema.    LABS:  Last CBC Lab Results  Component Value Date   WBC 9.8 09/28/2020   HGB 14.8 09/28/2020   HCT 43.8 09/28/2020   MCV 91.0 09/28/2020   MCH 29.2 10/04/2012   RDW 13.4 09/28/2020   PLT 328.0 41/29/0475   Last metabolic panel Lab Results  Component Value Date   GLUCOSE 121 (H) 09/28/2020   NA 136 09/28/2020   K 4.0 09/28/2020   CL 94 (L) 09/28/2020   CO2 29 09/28/2020   BUN 24 (H) 09/28/2020   CREATININE 0.68 09/28/2020   GFRNONAA >89 12/20/2011   CALCIUM 9.5 09/28/2020   PROT 7.7 09/28/2020   ALBUMIN 4.5 09/28/2020   BILITOT 0.3 09/28/2020   ALKPHOS 73 09/28/2020   AST 16 09/28/2020   ALT 17 09/28/2020   Last lipids Lab Results  Component Value Date   CHOL 130 09/28/2020   HDL 46.00 09/28/2020   LDLCALC 64 09/28/2020   LDLDIRECT 102.0 02/25/2019   TRIG 100.0 09/28/2020   CHOLHDL 3 09/28/2020   Last hemoglobin A1c Lab Results  Component Value Date   HGBA1C 7.2 (H) 09/28/2020   Last thyroid functions Lab Results  Component Value Date   TSH 0.53 09/28/2020   IMPRESSION AND PLAN:  #1 adult ADD. Adderall XR is been on shortage.  Patient has not taken any Adderall in over a month. We will get her on the immediate release/short acting Adderall 20 mg, 1 twice daily. We will transition back to XR once supply of that is back up.  #2 GAD.  Doing well on citalopram 20 mg a day and alprazolam 1 mg twice a day. No  new prescription needed today.  3.  Diabetes type 2. She does not tolerate metformin very well--GI side effects. She can stop this. Hemoglobin A1c check today. She says swelling in the lower extremities has not been a problem for her lately at all.  We will try pioglitazone if her A1c comes back greater than 7%.  #4 hypertension.  A little up here today but we will make no medication changes at this time--continue lisinopril 20 a day and HCTZ 25 a day.  We will have her monitor this a little more frequently at home. Electrolytes and creatinine today  #5 chronic pain.  Neck and low back are her biggest issues--she is followed by Dr. Nelva Bush. Of note, she also has a history of left knee medial meniscus tear--posterior horn.  This was diagnosed back in 2019.  Her pain is waxed and waned since then.  Has not received any injections for this.  Back in November 2022 it was hurting her for a week and she got some prednisone prescribed by Dr. Elisha Ponder and this seemed to help significantly.  Currently it is hurting her minimally.  An After Visit Summary was printed and given to the patient.  FOLLOW UP: Return in about 6 months (around 12/09/2021) for annual CPE (fasting).   Signed:  Crissie Sickles, MD           06/11/2021

## 2021-06-12 LAB — COMPREHENSIVE METABOLIC PANEL
AG Ratio: 1.4 (calc) (ref 1.0–2.5)
ALT: 27 U/L (ref 6–29)
AST: 22 U/L (ref 10–35)
Albumin: 4 g/dL (ref 3.6–5.1)
Alkaline phosphatase (APISO): 75 U/L (ref 37–153)
BUN: 19 mg/dL (ref 7–25)
CO2: 25 mmol/L (ref 20–32)
Calcium: 9.6 mg/dL (ref 8.6–10.4)
Chloride: 100 mmol/L (ref 98–110)
Creat: 0.73 mg/dL (ref 0.50–1.05)
Globulin: 2.8 g/dL (calc) (ref 1.9–3.7)
Glucose, Bld: 175 mg/dL — ABNORMAL HIGH (ref 65–99)
Potassium: 4 mmol/L (ref 3.5–5.3)
Sodium: 137 mmol/L (ref 135–146)
Total Bilirubin: 0.4 mg/dL (ref 0.2–1.2)
Total Protein: 6.8 g/dL (ref 6.1–8.1)

## 2021-06-12 LAB — HEMOGLOBIN A1C
Hgb A1c MFr Bld: 8 % of total Hgb — ABNORMAL HIGH (ref <5.7)
Mean Plasma Glucose: 183 mg/dL
eAG (mmol/L): 10.1 mmol/L

## 2021-06-13 ENCOUNTER — Encounter: Payer: Self-pay | Admitting: Family Medicine

## 2021-06-14 ENCOUNTER — Telehealth: Payer: Self-pay

## 2021-06-14 MED ORDER — PIOGLITAZONE HCL 15 MG PO TABS: 15.0000 mg | ORAL_TABLET | Freq: Every day | ORAL | 3 refills | Status: DC

## 2021-06-14 NOTE — Telephone Encounter (Signed)
-----   Message from Tammi Sou, MD sent at 06/13/2021  3:18 PM EST ----- Non-fasting sugar is 175 and Hba1c is 8%.  Goal Hba1c is <7%. Remind her to stop metformin completely. I recommend she start pioglitazone 15mg , 1 per day, #30, RF x 3.  Also work on lower carb and lower fat intake. All other labs normal. F/u in office in 69mo and we'll recheck these labs.-thx

## 2021-06-15 ENCOUNTER — Other Ambulatory Visit: Payer: Self-pay

## 2021-06-15 MED ORDER — AMPHETAMINE-DEXTROAMPHETAMINE 20 MG PO TABS: 20.0000 mg | ORAL_TABLET | Freq: Two times a day (BID) | ORAL | 0 refills | Status: DC

## 2021-06-15 NOTE — Telephone Encounter (Signed)
Pt advised refill sent. °

## 2021-06-15 NOTE — Telephone Encounter (Signed)
Erdine Soper Key: [id number] - PA Case ID: [id number] - Rx #: [id number]. As long as you remain covered by your prescription drug plan and there are no changes to your plan benefits, this request is approved from 06/14/2021 to 06/13/2024.   Pt is back in [place] and [facility] Rx pending

## 2021-07-12 ENCOUNTER — Telehealth: Payer: Self-pay

## 2021-07-12 MED ORDER — AMPHETAMINE-DEXTROAMPHETAMINE 20 MG PO TABS
20.0000 mg | ORAL_TABLET | Freq: Two times a day (BID) | ORAL | 0 refills | Status: DC
Start: 2021-07-12 — End: 2021-08-12

## 2021-07-12 NOTE — Telephone Encounter (Signed)
[facility] [place] [facility] location as last [facility] - she is still helping with her mother) ? ?amphetamine-dextroamphetamine (ADDERALL) 20 MG tablet [[id number]]  ?

## 2021-07-12 NOTE — Telephone Encounter (Signed)
Requesting: adderall ?Contract:12/06/19 ?UDS:09/28/20 ?Last Visit:06/11/21 ?Next Visit:n/a ?Last Refill:03/12/21 (60,0) ? ?Please Advise ? ?

## 2021-07-12 NOTE — Addendum Note (Signed)
Addended by: Tammi Sou on: 07/12/2021 04:44 PM ? ? Modules accepted: Orders ? ?

## 2021-07-12 NOTE — Telephone Encounter (Signed)
Adderall 20 mg, 1 twice daily, #60->eRx'd today ?

## 2021-08-12 ENCOUNTER — Other Ambulatory Visit: Payer: Self-pay

## 2021-08-12 MED ORDER — AMPHETAMINE-DEXTROAMPHETAMINE 20 MG PO TABS: 20.0000 mg | ORAL_TABLET | Freq: Two times a day (BID) | ORAL | 0 refills | Status: DC

## 2021-08-12 NOTE — Telephone Encounter (Signed)
[facility] [place] [facility] ? ?amphetamine-dextroamphetamine (ADDERALL) 20 MG tablet [[id number]]  ?

## 2021-08-12 NOTE — Telephone Encounter (Signed)
Requesting: adderall ?Contract: 12/06/19 ?UDS: 09/28/20 ?Last Visit: 06/11/21 ?Next Visit: advised to f/u 6 months ?Last Refill: 07/12/21(60,0) ? ?Please Advise. Med pending ?

## 2021-08-12 NOTE — Addendum Note (Signed)
Addended by: Deveron Furlong D on: 08/12/2021 04:21 PM ? ? Modules accepted: Orders ? ?

## 2021-08-19 ENCOUNTER — Encounter: Payer: Self-pay | Admitting: Family Medicine

## 2021-08-19 ENCOUNTER — Telehealth (INDEPENDENT_AMBULATORY_CARE_PROVIDER_SITE_OTHER): Payer: BC Managed Care – PPO | Admitting: Family Medicine

## 2021-08-19 DIAGNOSIS — K0889 Other specified disorders of teeth and supporting structures: Secondary | ICD-10-CM | POA: Diagnosis not present

## 2021-08-19 DIAGNOSIS — K047 Periapical abscess without sinus: Secondary | ICD-10-CM | POA: Diagnosis not present

## 2021-08-19 MED ORDER — IBUPROFEN 800 MG PO TABS: ORAL_TABLET | ORAL | 1 refills | Status: DC

## 2021-08-19 MED ORDER — AMOXICILLIN 500 MG PO CAPS: 500.0000 mg | ORAL_CAPSULE | Freq: Three times a day (TID) | ORAL | 0 refills | Status: DC

## 2021-08-19 NOTE — Progress Notes (Signed)
Virtual Visit via Video Note ? ?I connected with [name] on 08/19/21 at  4:00 PM EDT by a video enabled telemedicine application and verified that I am speaking with the correct person using two identifiers. ? Location patient: [place] ?Location provider:work or home office ?Persons participating in the virtual visit: patient, provider ? ?I discussed the limitations and requested verbal permission for telemedicine visit. The patient expressed understanding and agreed to proceed. ? ? ?HPI: ?61 y/o female being seen today for tooth pain. ?Started to get tooth pain a couple weeks ago, one of her upper front teeth. ?Went to a Pharmacist, community where she is currently living part-time in [facility].  Was told she had a tooth infection and that she needed the tooth pulled.  She declined to have it pulled at that time but has an appointment to get this done in 5 days.  At that time she was prescribed amoxicillin 500 mg 3 times daily.  She has finished this but still feels some discomfort in the tooth area of concern and is requesting antibiotics to be refilled until she returns to her dentist.   ?She has been taking ibuprofen 800 mg and this has helped some with her tooth pain. ? ?PMP AWARE reviewed today: most recent rx for percocet 10/325 was filled 08/03/21, # 150, rx by Dr. Nelva Bush. ?No red flags. ? ?Allergies  ?Allergen Reactions  ? Metformin And Related Nausea Only  ?  GI Upset  ? ?-COVID-19 vaccine status:  ?Immunization History  ?Administered Date(s) Administered  ? Influenza Inj Mdck Quad Pf 02/01/2018  ? Influenza,inj,Quad PF,6+ Mos 02/25/2019, 05/22/2020  ? Moderna Sars-Covid-2 Vaccination 05/22/2020  ? PFIZER(Purple Top)SARS-COV-2 Vaccination 07/20/2019, 08/10/2019  ? PNEUMOCOCCAL CONJUGATE-20 09/28/2020  ? Pneumococcal Polysaccharide-23 01/08/2018  ? ? ? ?ROS: See pertinent positives and negatives per HPI. ? ?Past Medical History:  ?Diagnosis Date  ? Chronic neck pain   ? DDD/cervical spondylosis--C3-C6.  Mild  central canal stenosis.  ? Chronic pain syndrome   ? pain mgmt by Dr. Nelva Bush   ? DDD (degenerative disc disease), lumbar   ? MRI 05/13/16: mild DDD at L3-4, and ruptured disc at L4-5 to the right.  Dr. Nelva Bush recommended right L5 selective nerve root block + referral to Dr. Gladstone Lighter for surgery (as of 06/04/16 f/u).  As of 02/2018-->chr LBP lateralizing to R glut, pain meds are tx.  ? Diabetes mellitus without complication ([place]) 05/25/4386  ? Dx'd with elevated random gluc + HbA1c 6.6%.  ? Essential hypertension 09/05/2017  ? Started HCTZ $RemoveBefo'25mg'mPOBiIybPDb$  qd 09/05/17  ? GAD (generalized anxiety disorder)   ? with anger control problems  ? GERD (gastroesophageal reflux disease)   ? History of pneumonia 2010  ? with hemoptysis; Pulm eval done, f/u CXR 04/01/09 after pneumonia was normal.  ? History of rheumatic fever   ? childhood--she reports no CV or Renal sequelae  ? Hyperlipidemia 12/2015  ? started statin 12/2015  ? IBS (irritable bowel syndrome)   ? Diarrhea predominant  ? Impingement syndrome of right shoulder   ? Pain in left knee   ? [facility]; MRI 09/2017--posterior horn medial meniscus tear (radial, not longitudinal).  Conservative rx as of 10/24/17 ortho f/u visit.  ? Polyarthralgia 2017  ? Rheum eval (Dr. Trudie Reed) 10/2015: no inflammatory arthritis suspected.  ? Tobacco abuse   ? 30+ pack-yr hx  ? Venous insufficiency 01/2012  ? ? ?Past Surgical History:  ?Procedure Laterality Date  ? CHOLECYSTECTOMY  2011  ? COLONOSCOPY  approx 2007  ?  Dr. Sharlett Iles (normal per pt--cannot locate path or procedure record)  ? ESOPHAGOGASTRODUODENOSCOPY  "   "  ? Mild esophagogastric junction inflammation, otherwise normal.  ? OVARY SURGERY  1993  ? left (ovarian cyst)  ? VAGINAL HYSTERECTOMY  2003  ? Nonmalignant reasons (DUB)  ? ? ? ?Current Outpatient Medications:  ?  Accu-Chek Softclix Lancets lancets, USE TO CHECK GLUCOSE ONCE DAILY, Disp: 100 each, Rfl: 12 ?  albuterol (PROAIR HFA) 108 (90 Base) MCG/ACT inhaler, Inhale 1-2 puffs every 6  (six) hours as needed into the lungs for wheezing. (Patient not taking: Reported on 09/28/2020), Disp: 1 Inhaler, Rfl: 1 ?  ALPRAZolam (XANAX) 0.5 MG tablet, TAKE 1 TABLET BY MOUTH TWICE A DAY AS NEEDED FOR ANXIETY, Disp: 60 tablet, Rfl: 5 ?  aluminum chloride (DRYSOL) 20 % external solution, Apply topically as needed. (Patient not taking: Reported on 09/28/2020), Disp: , Rfl:  ?  amphetamine-dextroamphetamine (ADDERALL) 20 MG tablet, Take 1 tablet (20 mg total) by mouth 2 (two) times daily., Disp: 60 tablet, Rfl: 0 ?  atorvastatin (LIPITOR) 40 MG tablet, Take 1 tablet (40 mg total) by mouth daily., Disp: 90 tablet, Rfl: 3 ?  blood glucose meter kit and supplies KIT, Dispense based on patient and insurance preference. Check glucose once daily, Disp: 1 each, Rfl: 0 ?  citalopram (CELEXA) 20 MG tablet, Take 1 tablet (20 mg total) by mouth daily., Disp: 90 tablet, Rfl: 3 ?  desloratadine (CLARINEX) 5 MG tablet, Take 1 tablet (5 mg total) by mouth daily., Disp: 90 tablet, Rfl: 3 ?  fluticasone (FLONASE) 50 MCG/ACT nasal spray, SPRAY 2 SPRAYS INTO EACH NOSTRIL EVERY DAY, Disp: 48 mL, Rfl: 1 ?  gabapentin (NEURONTIN) 300 MG capsule, Take 300 mg by mouth 3 (three) times daily., Disp: , Rfl:  ?  glucose blood test strip, USE TO CHECK GLUCOSE ONCE DAILY, Disp: 100 each, Rfl: 12 ?  hydrochlorothiazide (HYDRODIURIL) 25 MG tablet, Take 1 tablet (25 mg total) by mouth daily., Disp: 90 tablet, Rfl: 3 ?  lisinopril (ZESTRIL) 20 MG tablet, Take 1 tablet (20 mg total) by mouth daily., Disp: 90 tablet, Rfl: 3 ?  metaxalone (SKELAXIN) 800 MG tablet, Take 800 mg by mouth 3 (three) times daily as needed., Disp: , Rfl:  ?  oxyCODONE-acetaminophen (PERCOCET) 10-325 MG tablet, TAKE 1 TABLET BY MOUTH FOUR TIMES A DAY AS NEEDED, Disp: , Rfl: 0 ?  pioglitazone (ACTOS) 15 MG tablet, Take 1 tablet (15 mg total) by mouth daily., Disp: 30 tablet, Rfl: 3 ? ?EXAM: ? ?VITALS per patient if applicable:  ? ?  5/91/6384  ?  3:29 PM 11/16/2020  ?  3:38 PM  09/28/2020  ? 10:07 AM  ?Vitals with BMI  ?Height 5' 0.25" 5' 0.25" 5' 0.25"  ?Weight 200 lbs 180 lbs 6 oz 187 lbs 3 oz  ?BMI 38.75 34.96 36.27  ?Systolic 665 993 570  ?Diastolic 82 74 77  ?Pulse 99 77 73  ? ? ?GENERAL: alert, oriented, appears well and in no acute distress ? ?HEENT: atraumatic, conjunttiva clear, no obvious abnormalities on inspection of external nose and ears ? ?NECK: normal movements of the head and neck ? ?LUNGS: on inspection no signs of respiratory distress, breathing rate appears normal, no obvious gross SOB, gasping or wheezing ? ?CV: no obvious cyanosis ? ?MS: moves all visible extremities without noticeable abnormality ? ?PSYCH/NEURO: pleasant and cooperative, no obvious depression or anxiety, speech and thought processing grossly intact ? ?LABS: none today ? ?  Chemistry   ?   ?Component Value Date/Time  ? NA 137 06/11/2021 1618  ? K 4.0 06/11/2021 1618  ? CL 100 06/11/2021 1618  ? CO2 25 06/11/2021 1618  ? BUN 19 06/11/2021 1618  ? CREATININE 0.73 06/11/2021 1618  ?    ?Component Value Date/Time  ? CALCIUM 9.6 06/11/2021 1618  ? ALKPHOS 73 09/28/2020 1059  ? AST 22 06/11/2021 1618  ? ALT 27 06/11/2021 1618  ? BILITOT 0.4 06/11/2021 1618  ?  ? ?Lab Results  ?Component Value Date  ? HGBA1C 8.0 (H) 06/11/2021  ? ?ASSESSMENT AND PLAN: ? ?Discussed the following assessment and plan: ? ?Tooth abscess. ?Will extend amoxicillin 500 3 times daily x7 days.  She has appointment with her dentist to get this tooth extracted.  I did refill her ibuprofen 800 mg to take 1 every 8 hours as needed with food for dental pain. ?  ?I discussed the assessment and treatment plan with the patient. The patient was provided an opportunity to ask questions and all were answered. The patient agreed with the plan and demonstrated an understanding of the instructions. ?  ?F/u: prn ? ?Signed:  Crissie Sickles, MD           08/19/2021 ? ?

## 2021-09-01 DIAGNOSIS — G894 Chronic pain syndrome: Secondary | ICD-10-CM | POA: Diagnosis not present

## 2021-09-01 DIAGNOSIS — Z79899 Other long term (current) drug therapy: Secondary | ICD-10-CM | POA: Diagnosis not present

## 2021-09-01 DIAGNOSIS — Z5181 Encounter for therapeutic drug level monitoring: Secondary | ICD-10-CM | POA: Diagnosis not present

## 2021-09-01 DIAGNOSIS — M5416 Radiculopathy, lumbar region: Secondary | ICD-10-CM | POA: Diagnosis not present

## 2021-09-10 ENCOUNTER — Other Ambulatory Visit: Payer: Self-pay

## 2021-09-10 ENCOUNTER — Other Ambulatory Visit: Payer: Self-pay | Admitting: Family Medicine

## 2021-09-10 MED ORDER — AMPHETAMINE-DEXTROAMPHETAMINE 20 MG PO TABS: 20.0000 mg | ORAL_TABLET | Freq: Two times a day (BID) | ORAL | 0 refills | Status: DC

## 2021-09-10 NOTE — Telephone Encounter (Signed)
Patient refill request.  Please DO NOT use [facility] [place] [facility].  She is in [place].  [id number] [place] [place] - [address]  amphetamine-dextroamphetamine (ADDERALL) 20 MG tablet [[id number]]

## 2021-09-10 NOTE — Telephone Encounter (Signed)
Requesting: adderall Contract: 12/06/19 UDS: 09/28/20 Last Visit:08/19/21, acute; 06/11/21 RCI Next Visit: advised to f/u 3 months RCI Last Refill: 08/12/21(60,0)  Please Advise. Med pending

## 2021-09-13 ENCOUNTER — Other Ambulatory Visit: Payer: Self-pay

## 2021-09-13 MED ORDER — PIOGLITAZONE HCL 15 MG PO TABS: 15.0000 mg | ORAL_TABLET | Freq: Every day | ORAL | 0 refills | Status: DC

## 2021-09-27 ENCOUNTER — Other Ambulatory Visit: Payer: Self-pay | Admitting: Family Medicine

## 2021-10-11 ENCOUNTER — Other Ambulatory Visit: Payer: Self-pay | Admitting: Family Medicine

## 2021-10-11 ENCOUNTER — Telehealth: Payer: Self-pay

## 2021-10-11 NOTE — Telephone Encounter (Signed)
LVM for pt regarding completing mammogram. Pt due for physical in August.

## 2021-10-12 MED ORDER — LISINOPRIL 20 MG PO TABS: 20.0000 mg | ORAL_TABLET | Freq: Every day | ORAL | 3 refills | Status: AC

## 2021-10-12 MED ORDER — AMPHETAMINE-DEXTROAMPHETAMINE 20 MG PO TABS: 20.0000 mg | ORAL_TABLET | Freq: Two times a day (BID) | ORAL | 0 refills | Status: DC

## 2021-10-12 NOTE — Telephone Encounter (Signed)
Requesting: adderall Contract: 12/06/19 UDS: 09/28/20 Last Visit: 06/11/21 Next Visit: 11/05/21 Last Refill: 09/10/21(60,0)  RF request for lisinopril LOV: 06/11/21 Next ov: 11/05/21 Last written:09/28/20(90,3)  Please review and advise. Meds pending

## 2021-11-05 ENCOUNTER — Ambulatory Visit: Payer: BC Managed Care – PPO | Admitting: Family Medicine

## 2021-11-05 NOTE — Progress Notes (Deleted)
OFFICE VISIT  11/05/2021  CC: No chief complaint on file.  Patient is a 61 y.o. female who presents for 5 mo f/u DM, HTN, adult ADD, GAD. A/P as of last visit: "#1 adult ADD. Adderall XR is been on shortage.  Patient has not taken any Adderall in over a month. We will get her on the immediate release/short acting Adderall 20 mg, 1 twice daily. We will transition back to XR once supply of that is back up.   #2 GAD.  Doing well on citalopram 20 mg a day and alprazolam 1 mg twice a day. No new prescription needed today.  3.  Diabetes type 2. She does not tolerate metformin very well--GI side effects. She can stop this. Hemoglobin A1c check today. She says swelling in the lower extremities has not been a problem for her lately at all.  We will try pioglitazone if her A1c comes back greater than 7%.   #4 hypertension.  A little up here today but we will make no medication changes at this time--continue lisinopril 20 a day and HCTZ 25 a day.  We will have her monitor this a little more frequently at home. Electrolytes and creatinine today   #5 chronic pain.  Neck and low back are her biggest issues--she is followed by Dr. Nelva Bush. Of note, she also has a history of left knee medial meniscus tear--posterior horn.  This was diagnosed back in 2019.  Her pain is waxed and waned since then.  Has not received any injections for this.  Back in November 2022 it was hurting her for a week and she got some prednisone prescribed by Dr. Elisha Ponder and this seemed to help significantly.  Currently it is hurting her minimally."  INTERIM HX: ***  PMP AWARE reviewed today: most recent rx for Adderall was filled 10/12/2021, #60, rx by me. Most recent prescription for alprazolam was filled 09/05/2021, #60, prescription by me. Her opioid pain medication is prescribed by Dr. Nelva Bush. No red flags.   Past Medical History:  Diagnosis Date   Chronic neck pain    DDD/cervical spondylosis--C3-C6.  Mild  central canal stenosis.   Chronic pain syndrome    pain mgmt by Dr. Nelva Bush    DDD (degenerative disc disease), lumbar    MRI 05/13/16: mild DDD at L3-4, and ruptured disc at L4-5 to the right.  Dr. Nelva Bush recommended right L5 selective nerve root block + referral to Dr. Gladstone Lighter for surgery (as of 06/04/16 f/u).  As of 02/2018-->chr LBP lateralizing to R glut, pain meds are tx.   Diabetes mellitus without complication ([place]) 45/36/4680   Dx'd with elevated random gluc + HbA1c 6.6%.   Essential hypertension 09/05/2017   Started HCTZ 29m qd 09/05/17   GAD (generalized anxiety disorder)    with anger control problems   GERD (gastroesophageal reflux disease)    History of pneumonia 2010   with hemoptysis; Pulm eval done, f/u CXR 04/01/09 after pneumonia was normal.   History of rheumatic fever    childhood--she reports no CV or Renal sequelae   Hyperlipidemia 12/2015   started statin 12/2015   IBS (irritable bowel syndrome)    Diarrhea predominant   Impingement syndrome of right shoulder    Pain in left knee    [facility]; MRI 09/2017--posterior horn medial meniscus tear (radial, not longitudinal).  Conservative rx as of 10/24/17 ortho f/u visit.   Polyarthralgia 2017   Rheum eval (Dr. HTrudie Reed 10/2015: no inflammatory arthritis suspected.   Tobacco abuse  30+ pack-yr hx   Venous insufficiency 01/2012    Past Surgical History:  Procedure Laterality Date   CHOLECYSTECTOMY  2011   COLONOSCOPY  approx 2007   Dr. Sharlett Iles (normal per pt--cannot locate path or procedure record)   ESOPHAGOGASTRODUODENOSCOPY  "   "   Mild esophagogastric junction inflammation, otherwise normal.   [place]   left (ovarian cyst)   VAGINAL HYSTERECTOMY  2003   Nonmalignant reasons (DUB)    Outpatient Medications Prior to Visit  Medication Sig Dispense Refill   Accu-Chek Softclix Lancets lancets USE TO CHECK GLUCOSE ONCE DAILY 100 each 12   albuterol (PROAIR HFA) 108 (90 Base) MCG/ACT inhaler Inhale  1-2 puffs every 6 (six) hours as needed into the lungs for wheezing. (Patient not taking: Reported on 09/28/2020) 1 Inhaler 1   ALPRAZolam (XANAX) 0.5 MG tablet TAKE 1 TABLET BY MOUTH TWICE A DAY AS NEEDED FOR ANXIETY 60 tablet 5   aluminum chloride (DRYSOL) 20 % external solution Apply topically as needed.     amoxicillin (AMOXIL) 500 MG capsule Take 1 capsule (500 mg total) by mouth 3 (three) times daily. 21 capsule 0   amphetamine-dextroamphetamine (ADDERALL) 20 MG tablet Take 1 tablet (20 mg total) by mouth 2 (two) times daily. 60 tablet 0   atorvastatin (LIPITOR) 40 MG tablet Take 1 tablet (40 mg total) by mouth daily. 90 tablet 3   blood glucose meter kit and supplies KIT Dispense based on patient and insurance preference. Check glucose once daily 1 each 0   citalopram (CELEXA) 20 MG tablet Take 1 tablet (20 mg total) by mouth daily. 90 tablet 3   desloratadine (CLARINEX) 5 MG tablet Take 1 tablet (5 mg total) by mouth daily. 90 tablet 3   fluticasone (FLONASE) 50 MCG/ACT nasal spray SPRAY 2 SPRAYS INTO EACH NOSTRIL EVERY DAY 48 mL 1   gabapentin (NEURONTIN) 300 MG capsule Take 300 mg by mouth 3 (three) times daily. (Patient not taking: Reported on 08/19/2021)     glucose blood test strip USE TO CHECK GLUCOSE ONCE DAILY 100 each 12   hydrochlorothiazide (HYDRODIURIL) 25 MG tablet Take 1 tablet (25 mg total) by mouth daily. 90 tablet 3   ibuprofen (ADVIL) 800 MG tablet 1 tab po tid as needed for tooth pain 30 tablet 1   lisinopril (ZESTRIL) 20 MG tablet Take 1 tablet (20 mg total) by mouth daily. 90 tablet 3   metaxalone (SKELAXIN) 800 MG tablet Take 800 mg by mouth 3 (three) times daily as needed.     oxyCODONE-acetaminophen (PERCOCET) 10-325 MG tablet TAKE 1 TABLET BY MOUTH FOUR TIMES A DAY AS NEEDED  0   pioglitazone (ACTOS) 15 MG tablet Take 1 tablet (15 mg total) by mouth daily. 30 tablet 0   No facility-administered medications prior to visit.    Allergies  Allergen Reactions    Metformin And Related Nausea Only    GI Upset    ROS As per HPI  PE:    06/11/2021    3:29 PM 11/16/2020    3:38 PM 09/28/2020   10:07 AM  Vitals with BMI  Height 5' 0.25" 5' 0.25" 5' 0.25"  Weight 200 lbs 180 lbs 6 oz 187 lbs 3 oz  BMI 38.75 95.18 84.16  Systolic 606 301 601  Diastolic 82 74 77  Pulse 99 77 73     Physical Exam  ***  LABS:  Last CBC Lab Results  Component Value Date   WBC 9.8  09/28/2020   HGB 14.8 09/28/2020   HCT 43.8 09/28/2020   MCV 91.0 09/28/2020   MCH 29.2 10/04/2012   RDW 13.4 09/28/2020   PLT 328.0 06/17/3610   Last metabolic panel Lab Results  Component Value Date   GLUCOSE 175 (H) 06/11/2021   NA 137 06/11/2021   K 4.0 06/11/2021   CL 100 06/11/2021   CO2 25 06/11/2021   BUN 19 06/11/2021   CREATININE 0.73 06/11/2021   GFRNONAA >89 12/20/2011   CALCIUM 9.6 06/11/2021   PROT 6.8 06/11/2021   ALBUMIN 4.5 09/28/2020   BILITOT 0.4 06/11/2021   ALKPHOS 73 09/28/2020   AST 22 06/11/2021   ALT 27 06/11/2021   Last lipids Lab Results  Component Value Date   CHOL 130 09/28/2020   HDL 46.00 09/28/2020   LDLCALC 64 09/28/2020   LDLDIRECT 102.0 02/25/2019   TRIG 100.0 09/28/2020   CHOLHDL 3 09/28/2020   Last hemoglobin A1c Lab Results  Component Value Date   HGBA1C 8.0 (H) 06/11/2021   Last thyroid functions Lab Results  Component Value Date   TSH 0.53 09/28/2020   IMPRESSION AND PLAN:  No problem-specific Assessment & Plan notes found for this encounter.   An After Visit Summary was printed and given to the patient.  FOLLOW UP: No follow-ups on file.  Signed:  Crissie Sickles, MD           11/05/2021

## 2021-11-07 ENCOUNTER — Encounter: Payer: Self-pay | Admitting: Family Medicine

## 2021-11-13 ENCOUNTER — Other Ambulatory Visit: Payer: Self-pay | Admitting: Family Medicine

## 2021-11-15 NOTE — Telephone Encounter (Signed)
30 day supply sent. Patient due for follow up °

## 2021-11-16 NOTE — Telephone Encounter (Signed)
LM for pt to return call to discuss.  

## 2021-12-01 ENCOUNTER — Other Ambulatory Visit: Payer: Self-pay | Admitting: Family Medicine

## 2021-12-01 MED ORDER — AMPHETAMINE-DEXTROAMPHETAMINE 20 MG PO TABS: 20.0000 mg | ORAL_TABLET | Freq: Two times a day (BID) | ORAL | 0 refills | Status: DC

## 2021-12-01 NOTE — Telephone Encounter (Signed)
Requesting: adderall Contract: 12/06/19 UDS: 09/28/20 Last Visit: 06/11/21 Next Visit: 12/17/21 Last Refill: 10/12/21(60,0)  Please Advise. Med pending

## 2021-12-06 ENCOUNTER — Other Ambulatory Visit: Payer: Self-pay | Admitting: Family Medicine

## 2021-12-13 ENCOUNTER — Other Ambulatory Visit: Payer: Self-pay

## 2021-12-13 MED ORDER — FLUTICASONE PROPIONATE 50 MCG/ACT NA SUSP: NASAL | 1 refills | Status: DC

## 2021-12-17 ENCOUNTER — Encounter: Payer: BC Managed Care – PPO | Admitting: Family Medicine

## 2021-12-30 DIAGNOSIS — M545 Low back pain, unspecified: Secondary | ICD-10-CM | POA: Diagnosis not present

## 2021-12-30 DIAGNOSIS — M542 Cervicalgia: Secondary | ICD-10-CM | POA: Diagnosis not present

## 2021-12-30 DIAGNOSIS — Z79891 Long term (current) use of opiate analgesic: Secondary | ICD-10-CM | POA: Diagnosis not present

## 2021-12-30 DIAGNOSIS — G894 Chronic pain syndrome: Secondary | ICD-10-CM | POA: Diagnosis not present

## 2022-01-11 ENCOUNTER — Other Ambulatory Visit: Payer: Self-pay | Admitting: Family Medicine

## 2022-01-11 NOTE — Telephone Encounter (Signed)
Requesting: adderall Contract: 12/06/19 UDS: 09/28/20 Last Visit: 08/19/21 VV, acute & RCI 06/11/21 Next Visit: 02/02/22 Last Refill: 12/01/21(60,0)  Please Advise. Patient called during lunch today, cancelled appt for 9/20 and r/s for 10/11.

## 2022-01-12 ENCOUNTER — Encounter: Payer: BC Managed Care – PPO | Admitting: Family Medicine

## 2022-01-12 MED ORDER — AMPHETAMINE-DEXTROAMPHETAMINE 20 MG PO TABS: 20.0000 mg | ORAL_TABLET | Freq: Two times a day (BID) | ORAL | 0 refills | Status: DC

## 2022-02-02 ENCOUNTER — Ambulatory Visit: Payer: BC Managed Care – PPO | Admitting: Family Medicine

## 2022-02-02 DIAGNOSIS — F988 Other specified behavioral and emotional disorders with onset usually occurring in childhood and adolescence: Secondary | ICD-10-CM

## 2022-02-02 DIAGNOSIS — I10 Essential (primary) hypertension: Secondary | ICD-10-CM

## 2022-02-02 DIAGNOSIS — Z79899 Other long term (current) drug therapy: Secondary | ICD-10-CM

## 2022-02-02 DIAGNOSIS — E119 Type 2 diabetes mellitus without complications: Secondary | ICD-10-CM

## 2022-02-02 DIAGNOSIS — E7800 Pure hypercholesterolemia, unspecified: Secondary | ICD-10-CM

## 2022-02-02 NOTE — Progress Notes (Deleted)
Office Note 02/02/2022  CC: No chief complaint on file.  Patient is a 61 y.o. female who is here for annual health maintenance exam and follow-up diabetes, hypertension, anxiety, and adult ADD. A/P as of last visit: "#1 adult ADD. Adderall XR is been on shortage.  Patient has not taken any Adderall in over a month. We will get her on the immediate release/short acting Adderall 20 mg, 1 twice daily. We will transition back to XR once supply of that is back up.   #2 GAD.  Doing well on citalopram 20 mg a day and alprazolam 1 mg twice a day. No new prescription needed today.  3.  Diabetes type 2. She does not tolerate metformin very well--GI side effects. She can stop this. Hemoglobin A1c check today. She says swelling in the lower extremities has not been a problem for her lately at all.  We will try pioglitazone if her A1c comes back greater than 7%.   #4 hypertension.  A little up here today but we will make no medication changes at this time--continue lisinopril 20 a day and HCTZ 25 a day.  We will have her monitor this a little more frequently at home. Electrolytes and creatinine today   #5 chronic pain.  Neck and low back are her biggest issues--she is followed by Dr. Nelva Bush. Of note, she also has a history of left knee medial meniscus tear--posterior horn.  This was diagnosed back in 2019.  Her pain is waxed and waned since then.  Has not received any injections for this.  Back in November 2022 it was hurting her for a week and she got some prednisone prescribed by Dr. Elisha Ponder and this seemed to help significantly.  Currently it is hurting her minimally."  INTERIM HX: ***  Started pioglitazone in February.  PMP AWARE reviewed today: most recent rx for Adderall was filled 01/12/2022, #60, rx by me. Most recent alprazolam prescription filled 11/15/2021, #60, prescription by me.  No red flags.  Past Medical History:  Diagnosis Date   Chronic neck pain     DDD/cervical spondylosis--C3-C6.  Mild central canal stenosis.   Chronic pain syndrome    pain mgmt by Dr. Nelva Bush    DDD (degenerative disc disease), lumbar    MRI 05/13/16: mild DDD at L3-4, and ruptured disc at L4-5 to the right.  Dr. Nelva Bush recommended right L5 selective nerve root block + referral to Dr. Gladstone Lighter for surgery (as of 06/04/16 f/u).  As of 02/2018-->chr LBP lateralizing to R glut, pain meds are tx.   Diabetes mellitus without complication ([place]) 49/67/5916   Dx'd with elevated random gluc + HbA1c 6.6%.   Essential hypertension 09/05/2017   Started HCTZ 12m qd 09/05/17   GAD (generalized anxiety disorder)    with anger control problems   GERD (gastroesophageal reflux disease)    History of pneumonia 2010   with hemoptysis; Pulm eval done, f/u CXR 04/01/09 after pneumonia was normal.   History of rheumatic fever    childhood--she reports no CV or Renal sequelae   Hyperlipidemia 12/2015   started statin 12/2015   IBS (irritable bowel syndrome)    Diarrhea predominant   Impingement syndrome of right shoulder    Pain in left knee    [facility]; MRI 09/2017--posterior horn medial meniscus tear (radial, not longitudinal).  Conservative rx as of 10/24/17 ortho f/u visit.   Polyarthralgia 2017   Rheum eval (Dr. HTrudie Reed 10/2015: no inflammatory arthritis suspected.   Tobacco abuse  30+ pack-yr hx   Venous insufficiency 01/2012    Past Surgical History:  Procedure Laterality Date   CHOLECYSTECTOMY  2011   COLONOSCOPY  approx 2007   Dr. Sharlett Iles (normal per pt--cannot locate path or procedure record)   ESOPHAGOGASTRODUODENOSCOPY  "   "   Mild esophagogastric junction inflammation, otherwise normal.   [place]   left (ovarian cyst)   VAGINAL HYSTERECTOMY  2003   Nonmalignant reasons (DUB)    Family History  Problem Relation Age of Onset   Alcohol abuse Father    Heart disease Maternal Grandfather     Social History   Socioeconomic History   Marital status:  Single    Spouse name: Not on file   Number of children: Not on file   Years of education: Not on file   Highest education level: Not on file  Occupational History   Not on file  Tobacco Use   Smoking status: Light Smoker    Types: Cigarettes   Smokeless tobacco: Never  Vaping Use   Vaping Use: Never used  Substance and Sexual Activity   Alcohol use: Yes    Comment: occ wine   Drug use: No   Sexual activity: Not Currently  Other Topics Concern   Not on file  Social History Narrative   Divorced, one 41 y/o son.  Has 3 brothers and 3 sisters--healthy per pt report.   Orig from [place], [place] and relocated to [facility] 2003.   Works in Press photographer for [employer].   Tobacco: 30 + yrs, 1 ppd.   Rare glass of wine.   No drug use, no exercise.   Has a shitzu, parrot, and a parakeet.  Lives alone in [place].   Social Determinants of Health   Financial Resource Strain: Not on file  Food Insecurity: Not on file  Transportation Needs: Not on file  Physical Activity: Not on file  Stress: Not on file  Social Connections: Not on file  Intimate Partner Violence: Not on file    Outpatient Medications Prior to Visit  Medication Sig Dispense Refill   Accu-Chek Softclix Lancets lancets USE TO CHECK GLUCOSE ONCE DAILY 100 each 12   albuterol (PROAIR HFA) 108 (90 Base) MCG/ACT inhaler Inhale 1-2 puffs every 6 (six) hours as needed into the lungs for wheezing. (Patient not taking: Reported on 09/28/2020) 1 Inhaler 1   ALPRAZolam (XANAX) 0.5 MG tablet TAKE 1 TABLET BY MOUTH TWICE A DAY AS NEEDED FOR ANXIETY 60 tablet 0   aluminum chloride (DRYSOL) 20 % external solution Apply topically as needed.     amoxicillin (AMOXIL) 500 MG capsule Take 1 capsule (500 mg total) by mouth 3 (three) times daily. 21 capsule 0   amphetamine-dextroamphetamine (ADDERALL) 20 MG tablet Take 1 tablet (20 mg total) by mouth 2 (two) times daily. 60 tablet 0   atorvastatin (LIPITOR) 40 MG tablet Take 1  tablet (40 mg total) by mouth daily. 90 tablet 3   blood glucose meter kit and supplies KIT Dispense based on patient and insurance preference. Check glucose once daily 1 each 0   celecoxib (CELEBREX) 200 MG capsule Take 200 mg by mouth daily.     citalopram (CELEXA) 20 MG tablet TAKE 1 TABLET (20 MG TOTAL) BY MOUTH DAILY. MUST KEEP APPT FOR FURTHER REFILLS 90 tablet 0   desloratadine (CLARINEX) 5 MG tablet Take 1 tablet (5 mg total) by mouth daily. 90 tablet 3   fluticasone (FLONASE) 50 MCG/ACT nasal spray  SPRAY 2 SPRAYS INTO EACH NOSTRIL EVERY DAY 48 mL 1   gabapentin (NEURONTIN) 300 MG capsule Take 300 mg by mouth 3 (three) times daily. (Patient not taking: Reported on 08/19/2021)     glucose blood test strip USE TO CHECK GLUCOSE ONCE DAILY 100 each 12   hydrochlorothiazide (HYDRODIURIL) 25 MG tablet Take 1 tablet (25 mg total) by mouth daily. 90 tablet 3   ibuprofen (ADVIL) 800 MG tablet 1 tab po tid as needed for tooth pain 30 tablet 1   lisinopril (ZESTRIL) 20 MG tablet Take 1 tablet (20 mg total) by mouth daily. 90 tablet 3   metaxalone (SKELAXIN) 800 MG tablet Take 800 mg by mouth 3 (three) times daily as needed.     oxyCODONE-acetaminophen (PERCOCET) 10-325 MG tablet TAKE 1 TABLET BY MOUTH FOUR TIMES A DAY AS NEEDED  0   pioglitazone (ACTOS) 15 MG tablet Take 1 tablet (15 mg total) by mouth daily. 30 tablet 0   No facility-administered medications prior to visit.    Allergies  Allergen Reactions   Metformin And Related Nausea Only    GI Upset    ROS *** PE;    06/11/2021    3:29 PM 11/16/2020    3:38 PM 09/28/2020   10:07 AM  Vitals with BMI  Height 5' 0.25" 5' 0.25" 5' 0.25"  Weight 200 lbs 180 lbs 6 oz 187 lbs 3 oz  BMI 38.75 50.27 74.12  Systolic 878 676 720  Diastolic 82 74 77  Pulse 99 77 73     *** Pertinent labs:  Lab Results  Component Value Date   TSH 0.53 09/28/2020   Lab Results  Component Value Date   WBC 9.8 09/28/2020   HGB 14.8 09/28/2020   HCT  43.8 09/28/2020   MCV 91.0 09/28/2020   PLT 328.0 09/28/2020   Lab Results  Component Value Date   CREATININE 0.73 06/11/2021   BUN 19 06/11/2021   NA 137 06/11/2021   K 4.0 06/11/2021   CL 100 06/11/2021   CO2 25 06/11/2021   Lab Results  Component Value Date   ALT 27 06/11/2021   AST 22 06/11/2021   ALKPHOS 73 09/28/2020   BILITOT 0.4 06/11/2021   Lab Results  Component Value Date   CHOL 130 09/28/2020   Lab Results  Component Value Date   HDL 46.00 09/28/2020   Lab Results  Component Value Date   LDLCALC 64 09/28/2020   Lab Results  Component Value Date   TRIG 100.0 09/28/2020   Lab Results  Component Value Date   CHOLHDL 3 09/28/2020   Lab Results  Component Value Date   HGBA1C 8.0 (H) 06/11/2021   ASSESSMENT AND PLAN:   No problem-specific Assessment & Plan notes found for this encounter.  Health maintenance exam: Reviewed age and gender appropriate health maintenance issues (prudent diet, regular exercise, health risks of tobacco and excessive alcohol, use of seatbelts, fire alarms in home, use of sunscreen).  Also reviewed age and gender appropriate health screening as well as vaccine recommendations. Vaccines: Shingrix->***.  Tdap->***.  Flu->***. Labs: fasting HP + Hba1c.  Urine microalbumin/creatinine. Cervical ca screening: not a candidate due to hx of hysterectomy for benign dx. Breast ca screening: has been getting this through GYN MD Dr. Gaetano Net*** Colon ca screening: overdue->normal colonoscopy approx 2007 per pt report-->pt defers screening and wants to re-eval this decision in 1 yr.  An After Visit Summary was printed and given to the patient.  FOLLOW  UP:  No follow-ups on file.  Signed:  Crissie Sickles, MD           02/02/2022

## 2022-02-07 ENCOUNTER — Encounter: Payer: Self-pay | Admitting: Family Medicine

## 2022-03-07 ENCOUNTER — Other Ambulatory Visit: Payer: Self-pay | Admitting: Family Medicine

## 2022-03-10 ENCOUNTER — Encounter: Payer: Self-pay | Admitting: Family Medicine

## 2022-03-11 NOTE — Telephone Encounter (Signed)
Requesting: adderall Contract: 12/06/19 UDS: 09/28/20 Last Visit: 08/19/21 Next Visit: 03/28/22 Last Refill: 01/12/22(60,0)  Requesting: alprazolam Contract: 12/06/19 UDS: 09/28/20 Last Visit: 08/19/21 Next Visit: 03/28/22 Last Refill: 11/15/21(60,0)  RF request for citalopram LOV: 08/19/21 Next ov: 03/28/22 Last written: 12/06/21(90,0)   Please Advise. Meds pending

## 2022-03-11 NOTE — Telephone Encounter (Signed)
Patient called back. She got locked out of her account, new cell phone.   Ph[facility] is [facility] [address]

## 2022-03-12 MED ORDER — CITALOPRAM HYDROBROMIDE 20 MG PO TABS: 20.0000 mg | ORAL_TABLET | Freq: Every day | ORAL | 0 refills | Status: DC

## 2022-03-12 MED ORDER — AMPHETAMINE-DEXTROAMPHETAMINE 20 MG PO TABS: 20.0000 mg | ORAL_TABLET | Freq: Two times a day (BID) | ORAL | 0 refills | Status: DC

## 2022-03-12 MED ORDER — ALPRAZOLAM 0.5 MG PO TABS: ORAL_TABLET | ORAL | 0 refills | Status: DC

## 2022-03-28 ENCOUNTER — Ambulatory Visit: Payer: BC Managed Care – PPO | Admitting: Family Medicine

## 2022-03-28 ENCOUNTER — Encounter: Payer: Self-pay | Admitting: Family Medicine

## 2022-03-28 VITALS — BP 137/79 | HR 86 | Temp 98.1°F | Ht 60.25 in | Wt 208.4 lb

## 2022-03-28 DIAGNOSIS — Z0000 Encounter for general adult medical examination without abnormal findings: Secondary | ICD-10-CM

## 2022-03-28 DIAGNOSIS — F988 Other specified behavioral and emotional disorders with onset usually occurring in childhood and adolescence: Secondary | ICD-10-CM | POA: Diagnosis not present

## 2022-03-28 DIAGNOSIS — F411 Generalized anxiety disorder: Secondary | ICD-10-CM | POA: Diagnosis not present

## 2022-03-28 DIAGNOSIS — E785 Hyperlipidemia, unspecified: Secondary | ICD-10-CM

## 2022-03-28 DIAGNOSIS — E119 Type 2 diabetes mellitus without complications: Secondary | ICD-10-CM | POA: Diagnosis not present

## 2022-03-28 DIAGNOSIS — I10 Essential (primary) hypertension: Secondary | ICD-10-CM

## 2022-03-28 DIAGNOSIS — Z79899 Other long term (current) drug therapy: Secondary | ICD-10-CM

## 2022-03-28 LAB — LIPID PANEL
Cholesterol: 161 mg/dL (ref 0–200)
HDL: 45.1 mg/dL (ref >39.00)
NonHDL: 115.83
Total CHOL/HDL Ratio: 4
Triglycerides: 335 mg/dL — ABNORMAL HIGH (ref 0.0–149.0)
VLDL: 67 mg/dL — ABNORMAL HIGH (ref 0.0–40.0)

## 2022-03-28 LAB — POCT GLYCOSYLATED HEMOGLOBIN (HGB A1C)
HbA1c POC (<> result, manual entry): 7.8 % (ref 4.0–5.6)
HbA1c, POC (controlled diabetic range): 7.8 % — AB (ref 0.0–7.0)
HbA1c, POC (prediabetic range): 7.8 % — AB (ref 5.7–6.4)
Hemoglobin A1C: 7.8 % — AB (ref 4.0–5.6)

## 2022-03-28 LAB — COMPREHENSIVE METABOLIC PANEL
ALT: 30 U/L (ref 0–35)
AST: 25 U/L (ref 0–37)
Albumin: 4.4 g/dL (ref 3.5–5.2)
Alkaline Phosphatase: 86 U/L (ref 39–117)
BUN: 14 mg/dL (ref 6–23)
CO2: 29 mEq/L (ref 19–32)
Calcium: 9.4 mg/dL (ref 8.4–10.5)
Chloride: 104 mEq/L (ref 96–112)
Creatinine, Ser: 0.7 mg/dL (ref 0.40–1.20)
GFR: 93.6 mL/min (ref >60.00)
Glucose, Bld: 143 mg/dL — ABNORMAL HIGH (ref 70–99)
Potassium: 4.2 mEq/L (ref 3.5–5.1)
Sodium: 140 mEq/L (ref 135–145)
Total Bilirubin: 0.3 mg/dL (ref 0.2–1.2)
Total Protein: 7.2 g/dL (ref 6.0–8.3)

## 2022-03-28 LAB — CBC
HCT: 40.9 % (ref 36.0–46.0)
Hemoglobin: 13.7 g/dL (ref 12.0–15.0)
MCHC: 33.5 g/dL (ref 30.0–36.0)
MCV: 91.2 fl (ref 78.0–100.0)
Platelets: 320 10*3/uL (ref 150.0–400.0)
RBC: 4.48 Mil/uL (ref 3.87–5.11)
RDW: 13.8 % (ref 11.5–15.5)
WBC: 8.7 10*3/uL (ref 4.0–10.5)

## 2022-03-28 LAB — TSH: TSH: 0.8 u[IU]/mL (ref 0.35–5.50)

## 2022-03-28 LAB — LDL CHOLESTEROL, DIRECT: Direct LDL: 94 mg/dL

## 2022-03-28 MED ORDER — HYDROCHLOROTHIAZIDE 25 MG PO TABS: 25.0000 mg | ORAL_TABLET | Freq: Every day | ORAL | 3 refills | Status: AC

## 2022-03-28 MED ORDER — AMPHETAMINE-DEXTROAMPHETAMINE 20 MG PO TABS: 20.0000 mg | ORAL_TABLET | Freq: Two times a day (BID) | ORAL | 0 refills | Status: DC

## 2022-03-28 MED ORDER — CITALOPRAM HYDROBROMIDE 20 MG PO TABS: 20.0000 mg | ORAL_TABLET | Freq: Every day | ORAL | 3 refills | Status: DC

## 2022-03-28 MED ORDER — PIOGLITAZONE HCL 15 MG PO TABS: 15.0000 mg | ORAL_TABLET | Freq: Every day | ORAL | 3 refills | Status: DC

## 2022-03-28 NOTE — Progress Notes (Signed)
Office Note 03/28/2022  CC:  Chief Complaint  Patient presents with   Diabetes   ADD    HPI:  Patient is a 61 y.o. female who is here for annual health maintenance exam and f/u DM, HTN, HLD, anxiety, and adult ADD.  Having lots of sciatica pain due to her lumbar disc disease. Followed by Dr. Nelva Bush. Not exercising.  Not following diabetic diet.  Not taking her Actos with great compliance.  PMP AWARE reviewed today: most recent rx for Adderall and alprazolam were filled 03/12/2022, #30 of each, rx by me. No red flags.  Past Medical History:  Diagnosis Date   Chronic neck pain    DDD/cervical spondylosis--C3-C6.  Mild central canal stenosis.   Chronic pain syndrome    pain mgmt by Dr. Nelva Bush    DDD (degenerative disc disease), lumbar    MRI 05/13/16: mild DDD at L3-4, and ruptured disc at L4-5 to the right.  Dr. Nelva Bush recommended right L5 selective nerve root block + referral to Dr. Gladstone Lighter for surgery (as of 06/04/16 f/u).  As of 02/2018-->chr LBP lateralizing to R glut, pain meds are tx.   Diabetes mellitus without complication ([place]) 10/62/6948   Dx'd with elevated random gluc + HbA1c 6.6%.   Essential hypertension 09/05/2017   Started HCTZ 75m qd 09/05/17   GAD (generalized anxiety disorder)    with anger control problems   GERD (gastroesophageal reflux disease)    History of pneumonia 2010   with hemoptysis; Pulm eval done, f/u CXR 04/01/09 after pneumonia was normal.   History of rheumatic fever    childhood--she reports no CV or Renal sequelae   Hyperlipidemia 12/2015   started statin 12/2015   IBS (irritable bowel syndrome)    Diarrhea predominant   Impingement syndrome of right shoulder    Pain in left knee    [facility]; MRI 09/2017--posterior horn medial meniscus tear (radial, not longitudinal).  Conservative rx as of 10/24/17 ortho f/u visit.   Polyarthralgia 2017   Rheum eval (Dr. HTrudie Reed 10/2015: no inflammatory arthritis suspected.   Tobacco abuse    30+  pack-yr hx   Venous insufficiency 01/2012    Past Surgical History:  Procedure Laterality Date   CHOLECYSTECTOMY  2011   COLONOSCOPY  approx 2007   Dr. PSharlett Iles(normal per pt--cannot locate path or procedure record)   ESOPHAGOGASTRODUODENOSCOPY  "   "   Mild esophagogastric junction inflammation, otherwise normal.   [place]  left (ovarian cyst)   VAGINAL HYSTERECTOMY  2003   Nonmalignant reasons (DUB)    Family History  Problem Relation Age of Onset   Alcohol abuse Father    Heart disease Maternal Grandfather     Social History   Socioeconomic History   Marital status: Single    Spouse name: Not on file   Number of children: Not on file   Years of education: Not on file   Highest education level: Not on file  Occupational History   Not on file  Tobacco Use   Smoking status: Light Smoker    Types: Cigarettes   Smokeless tobacco: Never  Vaping Use   Vaping Use: Never used  Substance and Sexual Activity   Alcohol use: Yes    Comment: occ wine   Drug use: No   Sexual activity: Not Currently  Other Topics Concern   Not on file  Social History Narrative   Divorced, one 261y/o son.  Has 3 brothers and 3 sisters--healthy per  pt report.   Orig from [place], [place] and relocated to [facility] [place].   Works in Press photographer for [employer].   Tobacco: 30 + yrs, 1 ppd.   Rare glass of wine.   No drug use, no exercise.   Has a shitzu, parrot, and a parakeet.  Lives alone in [place].   Social Determinants of Health   Financial Resource Strain: Not on file  Food Insecurity: Not on file  Transportation Needs: Not on file  Physical Activity: Not on file  Stress: Not on file  Social Connections: Not on file  Intimate Partner Violence: Not on file    Outpatient Medications Prior to Visit  Medication Sig Dispense Refill   Accu-Chek Softclix Lancets lancets USE TO CHECK GLUCOSE ONCE DAILY 100 each 12   ALPRAZolam (XANAX) 0.5 MG tablet TAKE 1  TABLET BY MOUTH TWICE A DAY AS NEEDED FOR ANXIETY 30 tablet 0   atorvastatin (LIPITOR) 40 MG tablet Take 1 tablet (40 mg total) by mouth daily. 90 tablet 3   blood glucose meter kit and supplies KIT Dispense based on patient and insurance preference. Check glucose once daily 1 each 0   celecoxib (CELEBREX) 200 MG capsule Take 200 mg by mouth daily.     desloratadine (CLARINEX) 5 MG tablet Take 1 tablet (5 mg total) by mouth daily. 90 tablet 3   fluticasone (FLONASE) 50 MCG/ACT nasal spray SPRAY 2 SPRAYS INTO EACH NOSTRIL EVERY DAY 48 mL 1   glucose blood test strip USE TO CHECK GLUCOSE ONCE DAILY 100 each 12   ibuprofen (ADVIL) 800 MG tablet 1 tab po tid as needed for tooth pain 30 tablet 1   lisinopril (ZESTRIL) 20 MG tablet Take 1 tablet (20 mg total) by mouth daily. 90 tablet 3   metaxalone (SKELAXIN) 800 MG tablet Take 800 mg by mouth 3 (three) times daily as needed.     oxyCODONE-acetaminophen (PERCOCET) 10-325 MG tablet TAKE 1 TABLET BY MOUTH FOUR TIMES A DAY AS NEEDED  0   amphetamine-dextroamphetamine (ADDERALL) 20 MG tablet Take 1 tablet (20 mg total) by mouth 2 (two) times daily. 30 tablet 0   citalopram (CELEXA) 20 MG tablet Take 1 tablet (20 mg total) by mouth daily. MUST KEEP APPT FOR FURTHER REFILLS 30 tablet 0   hydrochlorothiazide (HYDRODIURIL) 25 MG tablet Take 1 tablet (25 mg total) by mouth daily. 90 tablet 3   pioglitazone (ACTOS) 15 MG tablet Take 1 tablet (15 mg total) by mouth daily. 30 tablet 0   albuterol (PROAIR HFA) 108 (90 Base) MCG/ACT inhaler Inhale 1-2 puffs every 6 (six) hours as needed into the lungs for wheezing. (Patient not taking: Reported on 09/28/2020) 1 Inhaler 1   aluminum chloride (DRYSOL) 20 % external solution Apply topically as needed. (Patient not taking: Reported on 03/28/2022)     amoxicillin (AMOXIL) 500 MG capsule Take 1 capsule (500 mg total) by mouth 3 (three) times daily. (Patient not taking: Reported on 03/28/2022) 21 capsule 0   gabapentin  (NEURONTIN) 300 MG capsule Take 300 mg by mouth 3 (three) times daily. (Patient not taking: Reported on 08/19/2021)     No facility-administered medications prior to visit.    Allergies  Allergen Reactions   Metformin And Related Nausea Only    GI Upset    ROS Review of Systems  Constitutional:  Negative for appetite change, chills, fatigue and fever.  HENT:  Negative for congestion, dental problem, ear pain and sore throat.   Eyes:  Negative for discharge, redness and visual disturbance.  Respiratory:  Negative for cough, chest tightness, shortness of breath and wheezing.   Cardiovascular:  Negative for chest pain, palpitations and leg swelling.  Gastrointestinal:  Negative for abdominal pain, blood in stool, diarrhea, nausea and vomiting.  Genitourinary:  Negative for difficulty urinating, dysuria, flank pain, frequency, hematuria and urgency.  Musculoskeletal:  Positive for back pain. Negative for arthralgias, joint swelling, myalgias and neck stiffness.  Skin:  Negative for pallor and rash.  Neurological:  Negative for dizziness, speech difficulty, weakness and headaches.  Hematological:  Negative for adenopathy. Does not bruise/bleed easily.  Psychiatric/Behavioral:  Negative for confusion and sleep disturbance. The patient is not nervous/anxious.     PE;    03/28/2022    9:04 AM 06/11/2021    3:29 PM 11/16/2020    3:38 PM  Vitals with BMI  Height 5' 0.25" 5' 0.25" 5' 0.25"  Weight 208 lbs 6 oz 200 lbs 180 lbs 6 oz  BMI 40.38 32.35 57.32  Systolic 202 542 706  Diastolic 79 82 74  Pulse 86 99 77   Exam chaperoned by Kavin Leech, CMA.   Gen: Alert, well appearing.  Patient is oriented to person, place, time, and situation. AFFECT: pleasant, lucid thought and speech. ENT: Ears: EACs clear, normal epithelium.  TMs with good light reflex and landmarks bilaterally.  Eyes: no injection, icteris, swelling, or exudate.  EOMI, PERRLA. Nose: no drainage or turbinate  edema/swelling.  No injection or focal lesion.  Mouth: lips without lesion/swelling.  Oral mucosa pink and moist.  Dentition intact and without obvious caries or gingival swelling.  Oropharynx without erythema, exudate, or swelling.  Neck: supple/nontender.  No LAD, mass, or TM.  Carotid pulses 2+ bilaterally, without bruits. CV: RRR, no m/r/g.   LUNGS: CTA bilat, nonlabored resps, good aeration in all lung fields. ABD: soft, NT, ND, BS normal.  No hepatospenomegaly or mass.  No bruits. EXT: no clubbing, cyanosis, or edema.  Musculoskeletal: no joint swelling, erythema, warmth, or tenderness.  ROM of all joints intact. Skin - no sores or suspicious lesions or rashes or color changes  Pertinent labs:  Lab Results  Component Value Date   TSH 0.53 09/28/2020   Lab Results  Component Value Date   WBC 9.8 09/28/2020   HGB 14.8 09/28/2020   HCT 43.8 09/28/2020   MCV 91.0 09/28/2020   PLT 328.0 09/28/2020   Lab Results  Component Value Date   CREATININE 0.73 06/11/2021   BUN 19 06/11/2021   NA 137 06/11/2021   K 4.0 06/11/2021   CL 100 06/11/2021   CO2 25 06/11/2021   Lab Results  Component Value Date   ALT 27 06/11/2021   AST 22 06/11/2021   ALKPHOS 73 09/28/2020   BILITOT 0.4 06/11/2021   Lab Results  Component Value Date   CHOL 130 09/28/2020   Lab Results  Component Value Date   HDL 46.00 09/28/2020   Lab Results  Component Value Date   LDLCALC 64 09/28/2020   Lab Results  Component Value Date   TRIG 100.0 09/28/2020   Lab Results  Component Value Date   CHOLHDL 3 09/28/2020   Lab Results  Component Value Date   HGBA1C 7.8 (A) 03/28/2022   HGBA1C 7.8 03/28/2022   HGBA1C 7.8 (A) 03/28/2022   HGBA1C 7.8 (A) 03/28/2022   ASSESSMENT AND PLAN:   #1 health maintenance exam: Reviewed age and gender appropriate health maintenance issues (prudent diet, regular exercise, health  risks of tobacco and excessive alcohol, use of seatbelts, fire alarms in home, use of  sunscreen).  Also reviewed age and gender appropriate health screening as well as vaccine recommendations. Vaccines: Tdap->declined.  Flu->declined.   Labs: fasting HP + Hba1c Cervical ca screening: not a candidate due to hx of hysterectomy for benign dx. Breast ca screening: has been getting this through GYN MD Dr. Silas Flood to set up Colon ca screening: overdue->normal colonoscopy approx 2007 per pt report-->defers for now.  #2 diabetes without complication.  Control is fair. POC Hba1c 7.8% today. Feet exam normal today. Urine microalbumin/creatinine today. She will try to improve compliance with Actos 15 mg a day and improve diet and exercise.  3. hypertension, well controlled on HCTZ 25 mg a day and lisinopril 20 mg a day. Electrolytes and creatinine today.  #4 hyperlipidemia, doing well on atorvastatin 40 mg a day. Lipid panel and hepatic panel today.  5.  GAD.  Stable on alprazolam 0.5 twice daily. Renewed controlled substance contract today.  Urine drug screen today.  6.  Adult ADD, doing well on Adderall 20 mg twice daily.  An After Visit Summary was printed and given to the patient.  FOLLOW UP:  Return in about 3 months (around 06/27/2022) for routine chronic illness f/u.  Signed:  Crissie Sickles, MD           03/28/2022

## 2022-03-28 NOTE — Patient Instructions (Signed)

## 2022-04-01 ENCOUNTER — Other Ambulatory Visit: Payer: Self-pay

## 2022-04-01 MED ORDER — AMPHETAMINE-DEXTROAMPHETAMINE 20 MG PO TABS: 20.0000 mg | ORAL_TABLET | Freq: Two times a day (BID) | ORAL | 0 refills | Status: DC

## 2022-04-01 NOTE — Telephone Encounter (Signed)
Patient stated pharmacy that prescription was sent to does not have med in stock.  [facility] is currently at [facility] [address]  [facility] [address].  amphetamine-dextroamphetamine (ADDERALL) 20 MG tablet [[id number]]  Pharmacy closes at 6pm

## 2022-04-01 NOTE — Telephone Encounter (Signed)
Requesting: adderall  Contract: 12/06/19 UDS: 09/28/20 Last Visit: 03/28/22 Next Visit: 06/27/22 Last Refill: 03/28/22 (180,0)  Please Advise. Med pending

## 2022-04-05 NOTE — Addendum Note (Signed)
Addended by: Emi Holes D on: 04/05/2022 12:16 PM   Modules accepted: Orders

## 2022-04-05 NOTE — Telephone Encounter (Signed)
LVM for pt to return call. Last refill was sent t [facility]. If a new prescription is needed, please let us know.  [facility] Day - Client TELEPHONE ADVICE RECORD Access[employer]urse Patient Name: [name] Gender: Female DOB: 08/10/1960 Age: 61 Y 21 D Return Phone Number: [phone] (Primary) Address: City/ State/ Zip: [place]  [address] Client [facility] Day - Client Client Site [facility] - Day Provider Santiago Bumpers - MD Contact Type Call Who Is Calling Patient / Member / Family / Caregiver Call Type Triage / Clinical Relationship To Patient Self Return Phone Number [phone] (Primary) Chief Complaint Prescription Refill or Medication Request (non symptomatic) Reason for Call Medication Question / Request Initial Comment The caller states she is trying to get her Adderall refilled at the pharmacy. The caller states she is out of her medication for 2 weeks. [facility] [address] No symptoms Translation No Nurse Assessment Nurse: Jean Rosenthal, RN, Cecile Date/Time (Eastern Time): 04/05/2022 12:17:10 AM Confirm and document reason for call. If symptomatic, describe symptoms. ---The caller states she is trying to get her Adderall refilled at the pharmacy. The caller states she is out of her medication for 2 weeks. She [place] [facility] [place], and reports [facility] [address] [facility] prescription and is wanting prescription sent to this location. No symptoms. Does the patient have any new or worsening symptoms? ---No Nurse: Jean Rosenthal, RN, Cecile Date/Time (Eastern Time): 04/05/2022 12:24:12 AM Please select the assessment type ---Request for controlled medication refill Is there an on-call physician for the client? ---Yes Do the client directives specifically allow for paging the on-call regarding scheduled drugs? ---No Additional  Documentation ---The caller states she is trying to get her Adderall refilled at the pharmacy. The caller states she is out of her medication for 2 weeks. She [place] [facility] [place] [facility] [place] [facility], and [facility] [address] [facility] and is wanting prescription sent to this location. No symptoms. PLEASE NOTE: All timestamps contained within this report are represented as [place] Standard Time. CONFIDENTIALTY NOTICE: This fax transmission is intended only for the addressee. It contains information that is legally privileged, confidential or otherwise protected from use or disclosure. If you are not the intended recipient, you are strictly prohibited from reviewing, disclosing, copying using or disseminating any of this information or taking any action in reliance on or regarding this information. If you have received this fax in error, please notify us immediately by telephone so that we can arrange for its return to [place]. Phone: [phone], Toll-Free: [phone], Fax: [phone] Page: 2 of 2 Call Id: [id number] Disp. Time Lamount Cohen Time) Disposition Final User 04/04/2022 5:26:57 PM Send To RN Personal Raymon Mutton, RN, Misty Stanley 04/04/2022 6:01:59 PM Attempt made - no message left Emiliano Dyer 04/04/2022 6:08:47 PM Attempt made - no message left Emiliano Dyer 04/04/2022 6:23:37 PM FINAL ATTEMPT MADE - no message left Emiliano Dyer 04/04/2022 6:23:43 PM Send to RN Final Attempt Lenore Manner, RN, Elease Hashimoto 04/04/2022 11:58:34 PM Attempt made - no message left Jean Rosenthal RN, Cecile 04/05/2022 12:26:08 AM Clinical Call Yes Jean Rosenthal, RN, Cecile Final Disposition 04/05/2022 12:26:08 AM Clinical Call Yes Jean Rosenthal, RN, Basil Dess

## 2022-04-05 NOTE — Telephone Encounter (Signed)
[facility] [place] [facility]  Please [facility] [address]  Patient aware cannot not be done until tomorrow.

## 2022-04-06 MED ORDER — AMPHETAMINE-DEXTROAMPHETAMINE 20 MG PO TABS: 20.0000 mg | ORAL_TABLET | Freq: Two times a day (BID) | ORAL | 0 refills | Status: DC

## 2022-04-06 NOTE — Telephone Encounter (Signed)
Patient advised prescription was resent.

## 2022-04-06 NOTE — Addendum Note (Signed)
Addended by: Emi Holes D on: 04/06/2022 09:00 AM   Modules accepted: Orders

## 2022-04-06 NOTE — Telephone Encounter (Signed)
Please resend to [facility] [address]

## 2022-04-14 DIAGNOSIS — M545 Low back pain, unspecified: Secondary | ICD-10-CM | POA: Insufficient documentation

## 2022-05-23 ENCOUNTER — Other Ambulatory Visit: Payer: Self-pay | Admitting: Family Medicine

## 2022-05-23 NOTE — Telephone Encounter (Signed)
Requesting: alprazolam Contract: 03/28/22 UDS: n/a Last Visit: 03/28/22 cpe Next Visit: 06/27/22 Last Refill: 03/12/22  Please Advise. Medication pending

## 2022-06-07 ENCOUNTER — Other Ambulatory Visit: Payer: Self-pay | Admitting: Family Medicine

## 2022-06-07 DIAGNOSIS — Z1231 Encounter for screening mammogram for malignant neoplasm of breast: Secondary | ICD-10-CM

## 2022-06-27 ENCOUNTER — Ambulatory Visit: Payer: BC Managed Care – PPO | Admitting: Family Medicine

## 2022-07-07 ENCOUNTER — Encounter: Payer: Self-pay | Admitting: Family Medicine

## 2022-07-07 ENCOUNTER — Other Ambulatory Visit: Payer: Self-pay | Admitting: Family Medicine

## 2022-07-07 MED ORDER — AMPHETAMINE-DEXTROAMPHETAMINE 20 MG PO TABS: 20.0000 mg | ORAL_TABLET | Freq: Two times a day (BID) | ORAL | 0 refills | Status: DC

## 2022-07-07 NOTE — Telephone Encounter (Signed)
Noted  

## 2022-07-07 NOTE — Telephone Encounter (Signed)
Requesting: adderall Contract: 03/28/22 UDS: 09/28/20 Last Visit: 03/28/22 Next Visit: 07/28/22 Last Refill: 04/06/22 (180,0)  Please Advise. Med pending

## 2022-07-28 ENCOUNTER — Ambulatory Visit: Payer: BC Managed Care – PPO | Admitting: Family Medicine

## 2022-08-29 NOTE — Patient Instructions (Signed)

## 2022-08-31 ENCOUNTER — Ambulatory Visit (INDEPENDENT_AMBULATORY_CARE_PROVIDER_SITE_OTHER): Payer: BC Managed Care – PPO | Admitting: Family Medicine

## 2022-08-31 DIAGNOSIS — E785 Hyperlipidemia, unspecified: Secondary | ICD-10-CM

## 2022-08-31 DIAGNOSIS — Z79899 Other long term (current) drug therapy: Secondary | ICD-10-CM

## 2022-08-31 DIAGNOSIS — E119 Type 2 diabetes mellitus without complications: Secondary | ICD-10-CM

## 2022-08-31 DIAGNOSIS — F988 Other specified behavioral and emotional disorders with onset usually occurring in childhood and adolescence: Secondary | ICD-10-CM

## 2022-08-31 DIAGNOSIS — I10 Essential (primary) hypertension: Secondary | ICD-10-CM

## 2022-09-16 ENCOUNTER — Encounter: Payer: Self-pay | Admitting: Family Medicine

## 2022-09-16 MED ORDER — ALPRAZOLAM 0.5 MG PO TABS: ORAL_TABLET | ORAL | 0 refills | Status: DC

## 2022-09-16 NOTE — Telephone Encounter (Signed)
#  30 alprazolam sent. Must see before any further refills regardless of insurance situation. Remind her that this is due to controlled substance prescribing laws.

## 2022-09-16 NOTE — Addendum Note (Signed)
Addended by: Jeoffrey Massed on: 09/16/2022 05:15 PM   Modules accepted: Orders

## 2022-10-11 NOTE — Progress Notes (Signed)
Erroneous

## 2022-10-11 NOTE — Progress Notes (Deleted)
error 

## 2022-10-13 ENCOUNTER — Other Ambulatory Visit: Payer: Self-pay | Admitting: Family Medicine

## 2022-10-13 NOTE — Telephone Encounter (Signed)
Pt stated the following:   I am in the process of getting Medicaid can you please call me in a least a months worth of my Adderall. I should have the Medicaid by next Month. Still not working applied for disability sure they will turn me down.

## 2022-10-14 MED ORDER — AMPHETAMINE-DEXTROAMPHETAMINE 20 MG PO TABS: 20.0000 mg | ORAL_TABLET | Freq: Two times a day (BID) | ORAL | 0 refills | Status: DC

## 2022-11-15 ENCOUNTER — Other Ambulatory Visit: Payer: Self-pay | Admitting: Family Medicine

## 2022-11-16 MED ORDER — AMPHETAMINE-DEXTROAMPHETAMINE 20 MG PO TABS: 20.0000 mg | ORAL_TABLET | Freq: Two times a day (BID) | ORAL | 0 refills | Status: DC

## 2022-11-17 ENCOUNTER — Telehealth: Payer: Self-pay | Admitting: Family Medicine

## 2022-11-17 NOTE — Telephone Encounter (Signed)
Prescription Request  11/17/2022  LOV: 03/28/2022  What is the name of the medication or equipment? amphetamine-dextroamphetamine (ADDERALL) 20 MG table  Patient is requesting refill. She is scheduled for 9/9   Have you contacted your pharmacy to request a refill? Yes   Which pharmacy would you like this sent to?  [id number] [place], [place] - [address] [place] [address] Phone: [phone] Fax: [phone]  [id number] [place] [place] - [address] [place] [address] Phone: [phone] Fax: [phone]  [id number] [place] [facility] - [address] [place] [address] Phone: [phone] Fax: [phone]  [facility] [address] [place] [place] - [address] [place] [address] Phone: [phone] Fax: [phone]    Patient notified that their request is being sent to the clinical staff for review and that they should receive a response within 2 business days.   Please advise at Mobile [phone] (mobile)

## 2022-12-07 ENCOUNTER — Other Ambulatory Visit: Payer: Self-pay | Admitting: Family Medicine

## 2022-12-08 ENCOUNTER — Encounter (INDEPENDENT_AMBULATORY_CARE_PROVIDER_SITE_OTHER): Payer: Self-pay

## 2022-12-22 ENCOUNTER — Encounter: Payer: Self-pay | Admitting: Family Medicine

## 2022-12-22 ENCOUNTER — Other Ambulatory Visit: Payer: Self-pay | Admitting: Family Medicine

## 2022-12-22 NOTE — Telephone Encounter (Signed)
Refill requested for Adderall. Next OV is 9/9. Per Pt:  I [facility] filled at the [facility] [place] I am taking care of my mom for the next few weeks.                                                                          [address], phone #[phone]

## 2022-12-23 MED ORDER — AMPHETAMINE-DEXTROAMPHETAMINE 20 MG PO TABS: 20.0000 mg | ORAL_TABLET | Freq: Two times a day (BID) | ORAL | 0 refills | Status: DC

## 2022-12-28 ENCOUNTER — Other Ambulatory Visit: Payer: Self-pay | Admitting: Family Medicine

## 2022-12-28 MED ORDER — AMPHETAMINE-DEXTROAMPHETAMINE 20 MG PO TABS: 20.0000 mg | ORAL_TABLET | Freq: Two times a day (BID) | ORAL | 0 refills | Status: DC

## 2023-01-02 ENCOUNTER — Ambulatory Visit: Payer: BC Managed Care – PPO | Admitting: Family Medicine

## 2023-01-12 ENCOUNTER — Encounter: Payer: Self-pay | Admitting: Family Medicine

## 2023-01-12 ENCOUNTER — Ambulatory Visit: Payer: Medicaid Other | Admitting: Family Medicine

## 2023-01-12 VITALS — BP 103/71 | HR 108 | Temp 96.3°F | Ht 60.25 in | Wt 192.4 lb

## 2023-01-12 DIAGNOSIS — Z7984 Long term (current) use of oral hypoglycemic drugs: Secondary | ICD-10-CM | POA: Diagnosis not present

## 2023-01-12 DIAGNOSIS — E119 Type 2 diabetes mellitus without complications: Secondary | ICD-10-CM

## 2023-01-12 DIAGNOSIS — Z79899 Other long term (current) drug therapy: Secondary | ICD-10-CM | POA: Diagnosis not present

## 2023-01-12 DIAGNOSIS — I152 Hypertension secondary to endocrine disorders: Secondary | ICD-10-CM

## 2023-01-12 DIAGNOSIS — F411 Generalized anxiety disorder: Secondary | ICD-10-CM | POA: Diagnosis not present

## 2023-01-12 DIAGNOSIS — E1159 Type 2 diabetes mellitus with other circulatory complications: Secondary | ICD-10-CM | POA: Diagnosis not present

## 2023-01-12 DIAGNOSIS — F988 Other specified behavioral and emotional disorders with onset usually occurring in childhood and adolescence: Secondary | ICD-10-CM

## 2023-01-12 DIAGNOSIS — M722 Plantar fascial fibromatosis: Secondary | ICD-10-CM | POA: Diagnosis not present

## 2023-01-12 DIAGNOSIS — E7800 Pure hypercholesterolemia, unspecified: Secondary | ICD-10-CM

## 2023-01-12 LAB — POCT GLYCOSYLATED HEMOGLOBIN (HGB A1C)
HbA1c POC (<> result, manual entry): 9.2 % (ref 4.0–5.6)
HbA1c, POC (controlled diabetic range): 9.2 % — AB (ref 0.0–7.0)
HbA1c, POC (prediabetic range): 9.2 % — AB (ref 5.7–6.4)
Hemoglobin A1C: 9.2 % — AB (ref 4.0–5.6)

## 2023-01-12 MED ORDER — TRIAMCINOLONE ACETONIDE 40 MG/ML IJ SUSP
40.0000 mg | Freq: Once | INTRAMUSCULAR | Status: AC
Start: 2023-01-12 — End: 2023-01-12
  Administered 2023-01-12: 40 mg via INTRA_ARTICULAR

## 2023-01-12 MED ORDER — PIOGLITAZONE HCL 30 MG PO TABS: 30.0000 mg | ORAL_TABLET | Freq: Every day | ORAL | 1 refills | Status: DC

## 2023-01-12 MED ORDER — CITALOPRAM HYDROBROMIDE 20 MG PO TABS: 20.0000 mg | ORAL_TABLET | Freq: Every day | ORAL | 3 refills | Status: DC

## 2023-01-12 NOTE — Patient Instructions (Signed)
   It was very nice to see you today!   Community Pharmacy Locations offering vaccines:   Surveyor, quantity [facility]   10 vaccines are offered at the [facility]:  Covid, flu, Tdap, shingles, RSV, pneumonia, meningococcal, hepatitis A, hepatitis B, and HPV.  Patients must be eligible for vaccines according to the CDC/ACIP guidelines.   PLEASE NOTE:  If labs were collected or images ordered, we will inform you of  results once we have received them and reviewed. We will contact you either by echart message, or telephone call.     Please give ample time to the testing facility, and our office to run, receive and review results. Please do not call inquiring of results, even if you can see them in your chart. We will contact you as soon as we are able. If it has been over 1 week since the test was completed, and you have not yet heard from [place], then please call us.   If we ordered any referrals today, please let us know if you have not heard from their office within the next 2 weeks. You should receive a letter via MyChart confirming if the referral was approved and their office contact information to schedule.

## 2023-01-12 NOTE — Progress Notes (Signed)
OFFICE VISIT  01/12/2023  CC:  Chief Complaint  Patient presents with   Medical Management of Chronic Issues    Pt is not fasting and has not been taking meds    Patient is a 62 y.o. female who presents for follow-up diabetes, hypertension, anxiety, and adult ADD. A/P as of last visit: "diabetes without complication.  Control is fair. POC Hba1c 7.8% today. Feet exam normal today. Urine microalbumin/creatinine today. She will try to improve compliance with Actos 15 mg a day and improve diet and exercise.   3. hypertension, well controlled on HCTZ 25 mg a day and lisinopril 20 mg a day. Electrolytes and creatinine today.   #4 hyperlipidemia, doing well on atorvastatin 40 mg a day. Lipid panel and hepatic panel today.   5.  GAD.  Stable on alprazolam 0.5 twice daily. Renewed controlled substance contract today.  Urine drug screen today.   6.  Adult ADD, doing well on Adderall 20 mg twice daily."  INTERIM HX: Last visit all of her labs were normal except triglycerides were significantly elevated.  [name] has been depressed lately because she lost her job about 6 months ago.  She had this job for decades.   She lost her health insurance and has been off of all of her medications.  She has but only her Adderall and Xanax out-of-pocket.  She does not check her blood pressure at home but she does have a cuff.  No home glucose monitoring.  Her biggest concern today is right heel pain that started about 4 months ago.  Progressively worsening.  No preceding injury or overuse.  Hurts the worst upon getting out of bed in the morning. Has never had this problem before that she recalls.  Left heel feels normal. Heel lift has not helped.  Icing has not helped.  PMP AWARE reviewed today: most recent rx for Adderall 20 mg was filled 12/28/2022, # 60, rx by me. Most recent alprazolam 0.5 mg prescription filled 12/08/2022, #30, prescription by me. No red flags.  ROS as above, plus--> no  fevers, no CP, no SOB, no wheezing, no cough, no dizziness, no HAs, no rashes, no melena/hematochezia.  No polyuria or polydipsia.  No myalgias or arthralgias.  No focal weakness, paresthesias, or tremors.  No acute vision or hearing abnormalities.  No dysuria or unusual/new urinary urgency or frequency.  No recent changes in lower legs. No n/v/d or abd pain.  No palpitations.    Past Medical History:  Diagnosis Date   Chronic neck pain    DDD/cervical spondylosis--C3-C6.  Mild central canal stenosis.   Chronic pain syndrome    pain mgmt by Dr. Ethelene Hal    DDD (degenerative disc disease), lumbar    MRI 05/13/16: mild DDD at L3-4, and ruptured disc at L4-5 to the right.  Dr. Ethelene Hal recommended right L5 selective nerve root block + referral to Dr. Darrelyn Hillock for surgery (as of 06/04/16 f/u).  As of 02/2018-->chr LBP lateralizing to R glut, pain meds are tx.   Diabetes mellitus without complication (HCC) 01/04/2016   Dx'd with elevated random gluc + HbA1c 6.6%.   Essential hypertension 09/05/2017   Started HCTZ 25mg  qd 09/05/17   GAD (generalized anxiety disorder)    with anger control problems   GERD (gastroesophageal reflux disease)    History of pneumonia 2010   with hemoptysis; Pulm eval done, f/u CXR 04/01/09 after pneumonia was normal.   History of rheumatic fever    childhood--she reports no CV or  Renal sequelae   Hyperlipidemia 12/2015   started statin 12/2015   IBS (irritable bowel syndrome)    Diarrhea predominant   Impingement syndrome of right shoulder    Pain in left knee    [facility]; MRI 09/2017--posterior horn medial meniscus tear (radial, not longitudinal).  Conservative rx as of 10/24/17 ortho f/u visit.   Polyarthralgia 2017   Rheum eval (Dr. Nickola Major) 10/2015: no inflammatory arthritis suspected.   Tobacco abuse    30+ pack-yr hx   Venous insufficiency 01/2012    Past Surgical History:  Procedure Laterality Date   CHOLECYSTECTOMY  2011   COLONOSCOPY  approx 2007   Dr.  Jarold Motto (normal per pt--cannot locate path or procedure record)   ESOPHAGOGASTRODUODENOSCOPY  "   "   Mild esophagogastric junction inflammation, otherwise normal.   OVARY SURGERY  1993   left (ovarian cyst)   VAGINAL HYSTERECTOMY  2003   Nonmalignant reasons (DUB)    Outpatient Medications Prior to Visit  Medication Sig Dispense Refill   Accu-Chek Softclix Lancets lancets USE TO CHECK GLUCOSE ONCE DAILY (Patient not taking: Reported on 01/12/2023) 100 each 12   albuterol (PROAIR HFA) 108 (90 Base) MCG/ACT inhaler Inhale 1-2 puffs every 6 (six) hours as needed into the lungs for wheezing. (Patient not taking: Reported on 09/28/2020) 1 Inhaler 1   ALPRAZolam (XANAX) 0.5 MG tablet TAKE 1 TABLET BY MOUTH TWICE A DAY AS NEEDED FOR ANXIETY (Patient not taking: Reported on 01/12/2023) 30 tablet 1   aluminum chloride (DRYSOL) 20 % external solution Apply topically as needed. (Patient not taking: Reported on 03/28/2022)     amphetamine-dextroamphetamine (ADDERALL) 20 MG tablet Take 1 tablet (20 mg total) by mouth 2 (two) times daily. (Patient not taking: Reported on 01/12/2023) 60 tablet 0   atorvastatin (LIPITOR) 40 MG tablet Take 1 tablet (40 mg total) by mouth daily. (Patient not taking: Reported on 01/12/2023) 90 tablet 3   blood glucose meter kit and supplies KIT Dispense based on patient and insurance preference. Check glucose once daily (Patient not taking: Reported on 01/12/2023) 1 each 0   celecoxib (CELEBREX) 200 MG capsule Take 200 mg by mouth daily. (Patient not taking: Reported on 01/12/2023)     desloratadine (CLARINEX) 5 MG tablet Take 1 tablet (5 mg total) by mouth daily. (Patient not taking: Reported on 01/12/2023) 90 tablet 3   fluticasone (FLONASE) 50 MCG/ACT nasal spray SPRAY 2 SPRAYS INTO EACH NOSTRIL EVERY DAY (Patient not taking: Reported on 01/12/2023) 48 mL 1   glucose blood test strip USE TO CHECK GLUCOSE ONCE DAILY (Patient not taking: Reported on 01/12/2023) 100 each 12    hydrochlorothiazide (HYDRODIURIL) 25 MG tablet Take 1 tablet (25 mg total) by mouth daily. (Patient not taking: Reported on 01/12/2023) 90 tablet 3   lisinopril (ZESTRIL) 20 MG tablet Take 1 tablet (20 mg total) by mouth daily. 90 tablet 3   metaxalone (SKELAXIN) 800 MG tablet Take 800 mg by mouth 3 (three) times daily as needed. (Patient not taking: Reported on 01/12/2023)     oxyCODONE-acetaminophen (PERCOCET) 10-325 MG tablet TAKE 1 TABLET BY MOUTH FOUR TIMES A DAY AS NEEDED (Patient not taking: Reported on 01/12/2023)  0   citalopram (CELEXA) 20 MG tablet Take 1 tablet (20 mg total) by mouth daily. (Patient not taking: Reported on 01/12/2023) 90 tablet 3   pioglitazone (ACTOS) 15 MG tablet Take 1 tablet (15 mg total) by mouth daily. (Patient not taking: Reported on 01/12/2023) 90 tablet 3   No  facility-administered medications prior to visit.    Allergies  Allergen Reactions   Metformin And Related Nausea Only    GI Upset    Review of Systems As per HPI  PE:    01/12/2023    1:12 PM 03/28/2022    9:04 AM 06/11/2021    3:29 PM  Vitals with BMI  Height 5' 0.25" 5' 0.25" 5' 0.25"  Weight 192 lbs 6 oz 208 lbs 6 oz 200 lbs  BMI 37.28 40.38 38.75  Systolic 103 137 098  Diastolic 71 79 82  Pulse 108 86 99    Physical Exam  Gen: Alert, well appearing.  Patient is oriented to person, place, time, and situation. Right heel with tenderness to palpation over plantar aspect of the calcaneus, most significantly over the medial calcaneal tubercle.  LABS:  Last CBC Lab Results  Component Value Date   WBC 8.7 03/28/2022   HGB 13.7 03/28/2022   HCT 40.9 03/28/2022   MCV 91.2 03/28/2022   MCH 29.2 10/04/2012   RDW 13.8 03/28/2022   PLT 320.0 03/28/2022   Last metabolic panel Lab Results  Component Value Date   GLUCOSE 143 (H) 03/28/2022   NA 140 03/28/2022   K 4.2 03/28/2022   CL 104 03/28/2022   CO2 29 03/28/2022   BUN 14 03/28/2022   CREATININE 0.70 03/28/2022   GFR 93.60  03/28/2022   CALCIUM 9.4 03/28/2022   PROT 7.2 03/28/2022   ALBUMIN 4.4 03/28/2022   BILITOT 0.3 03/28/2022   ALKPHOS 86 03/28/2022   AST 25 03/28/2022   ALT 30 03/28/2022   Last lipids Lab Results  Component Value Date   CHOL 161 03/28/2022   HDL 45.10 03/28/2022   LDLCALC 64 09/28/2020   LDLDIRECT 94.0 03/28/2022   TRIG 335.0 (H) 03/28/2022   CHOLHDL 4 03/28/2022   Last hemoglobin A1c Lab Results  Component Value Date   HGBA1C 9.2 (A) 01/12/2023   HGBA1C 9.2 01/12/2023   HGBA1C 9.2 (A) 01/12/2023   HGBA1C 9.2 (A) 01/12/2023   Last thyroid functions Lab Results  Component Value Date   TSH 0.80 03/28/2022   IMPRESSION AND PLAN:  #1 recurrent major depressive disorder. Worse lately since being off her citalopram and losing her job. Restart citalopram 20 mg a day.  2.  Adult ADD. Stable on Adderall 20 mg twice daily and alprazolam 0.5 mg twice daily as needed. Urine drug screen today.  3.  History of hypertension, was taking HCTZ 25 mg a day and lisinopril 20 mg a day when I last saw her. She has been off of her medication and today's blood pressure was 103/71.   Will hold off on any restart of blood pressure medication at this time.  #4 diabetes without complication, poor control.  Point-of-care hemoglobin A1c today is 9.2%. Has been off of medication. She had been tolerating the pioglitazone 15 mg tab. Will restart her pioglitazone at the 30 mg daily dosing. TLC recommended. Consider GLP 1 agonist or SGLT 2 inhibitor in future if insurance will cover.  #5 right plantar fasciitis. Patient elected for steroid injection today. Postop shoe fitted/dispensed.  Ultrasound-guided injection is preferred based on studies that show increased duration, increased effect, greater accuracy, decreased procedural pain, increased response rate, and decreased cost with ultrasound-guided versus blind injection. Procedure: Real-time ultrasound guided injection of right plantar  fascia. Device: GE [employer] informed consent obtained.  Timeout conducted.  No overlying erythema, induration, or other signs of local infection. After sterile  prep with Betadine, injected 40 mg Kenalog +2 mL of 1% plain lidocaine.  Injectate seen filling just superficial to the plantar fascia. Patient tolerated the procedure well.  No immediate complications.  Post-injection care discussed. Advised to call if fever/chills, erythema, drainage, or persistent bleeding. Impression: Technically successful ultrasound-guided injection.  An After Visit Summary was printed and given to the patient.  FOLLOW UP: Return in about 4 weeks (around 02/09/2023) for f/u bp/gluc/heel. Next CPE 03/2023 Signed:  Santiago Bumpers, MD           01/12/2023

## 2023-01-30 ENCOUNTER — Other Ambulatory Visit: Payer: Self-pay | Admitting: Family Medicine

## 2023-01-31 MED ORDER — AMPHETAMINE-DEXTROAMPHETAMINE 20 MG PO TABS: 20.0000 mg | ORAL_TABLET | Freq: Two times a day (BID) | ORAL | 0 refills | Status: DC

## 2023-02-09 ENCOUNTER — Ambulatory Visit: Payer: Medicaid Other | Admitting: Family Medicine

## 2023-02-14 ENCOUNTER — Ambulatory Visit: Payer: Medicaid Other | Admitting: Family Medicine

## 2023-02-14 ENCOUNTER — Encounter: Payer: Self-pay | Admitting: Family Medicine

## 2023-02-14 VITALS — BP 118/74 | HR 91 | Wt 189.8 lb

## 2023-02-14 DIAGNOSIS — E119 Type 2 diabetes mellitus without complications: Secondary | ICD-10-CM | POA: Diagnosis not present

## 2023-02-14 DIAGNOSIS — F3341 Major depressive disorder, recurrent, in partial remission: Secondary | ICD-10-CM | POA: Diagnosis not present

## 2023-02-14 DIAGNOSIS — Z79899 Other long term (current) drug therapy: Secondary | ICD-10-CM

## 2023-02-14 DIAGNOSIS — E7800 Pure hypercholesterolemia, unspecified: Secondary | ICD-10-CM

## 2023-02-14 DIAGNOSIS — F988 Other specified behavioral and emotional disorders with onset usually occurring in childhood and adolescence: Secondary | ICD-10-CM

## 2023-02-14 DIAGNOSIS — E1159 Type 2 diabetes mellitus with other circulatory complications: Secondary | ICD-10-CM

## 2023-02-14 DIAGNOSIS — F411 Generalized anxiety disorder: Secondary | ICD-10-CM

## 2023-02-14 DIAGNOSIS — I152 Hypertension secondary to endocrine disorders: Secondary | ICD-10-CM

## 2023-02-14 DIAGNOSIS — Z7984 Long term (current) use of oral hypoglycemic drugs: Secondary | ICD-10-CM

## 2023-02-14 LAB — LIPID PANEL
Cholesterol: 182 mg/dL (ref 0–200)
HDL: 52.8 mg/dL (ref >39.00)
LDL Cholesterol: 107 mg/dL — ABNORMAL HIGH (ref 0–99)
NonHDL: 128.89
Total CHOL/HDL Ratio: 3
Triglycerides: 108 mg/dL (ref 0.0–149.0)
VLDL: 21.6 mg/dL (ref 0.0–40.0)

## 2023-02-14 LAB — COMPREHENSIVE METABOLIC PANEL
ALT: 29 U/L (ref 0–35)
AST: 21 U/L (ref 0–37)
Albumin: 4.4 g/dL (ref 3.5–5.2)
Alkaline Phosphatase: 94 U/L (ref 39–117)
BUN: 16 mg/dL (ref 6–23)
CO2: 33 meq/L — ABNORMAL HIGH (ref 19–32)
Calcium: 9.8 mg/dL (ref 8.4–10.5)
Chloride: 97 meq/L (ref 96–112)
Creatinine, Ser: 0.64 mg/dL (ref 0.40–1.20)
GFR: 95.05 mL/min (ref >60.00)
Glucose, Bld: 149 mg/dL — ABNORMAL HIGH (ref 70–99)
Potassium: 4.1 meq/L (ref 3.5–5.1)
Sodium: 138 meq/L (ref 135–145)
Total Bilirubin: 0.3 mg/dL (ref 0.2–1.2)
Total Protein: 7.5 g/dL (ref 6.0–8.3)

## 2023-02-14 MED ORDER — SEMAGLUTIDE(0.25 OR 0.5MG/DOS) 2 MG/3ML ~~LOC~~ SOPN: PEN_INJECTOR | SUBCUTANEOUS | Status: DC

## 2023-02-14 MED ORDER — ATORVASTATIN CALCIUM 40 MG PO TABS: 40.0000 mg | ORAL_TABLET | Freq: Every day | ORAL | 3 refills | Status: AC

## 2023-02-14 NOTE — Progress Notes (Unsigned)
OFFICE VISIT  02/14/2023  CC:  Chief Complaint  Patient presents with   Diabetes   Hypertension    Patient is a 62 y.o. female who presents for 1 month follow-up depression and anxiety, hypertension, diabetes, heel pain A/P as of last visit: "#1 recurrent major depressive disorder. Worse lately since being off her citalopram and losing her job. Restart citalopram 20 mg a day.   2.  Adult ADD. Stable on Adderall 20 mg twice daily and alprazolam 0.5 mg twice daily as needed. Urine drug screen today.   3.  History of hypertension, was taking HCTZ 25 mg a day and lisinopril 20 mg a day when I last saw her. She has been off of her medication and today's blood pressure was 103/71.   Will hold off on any restart of blood pressure medication at this time.   #4 diabetes without complication, poor control.  Point-of-care hemoglobin A1c today is 9.2%. Has been off of medication. She had been tolerating the pioglitazone 15 mg tab. Will restart her pioglitazone at the 30 mg daily dosing. TLC recommended. Consider GLP 1 agonist or SGLT 2 inhibitor in future if insurance will cover.   #5 right plantar fasciitis. Patient elected for steroid injection today. Postop shoe fitted/dispensed."  INTERIM HX: Better regarding mood and anxiety level.   No home gluc monitoring. Turns out she HAD been still taking her bp med at the time of last visit.  Right heel is significantly improved--- approximately 60 to 75% better.  She feels it getting worse last couple of days because she has not had her postop shoe to wear.  She lost it.  She has had progressive right lumbar radiculopathy pain and is going to get an epidural injection by Dr. Ethelene Hal soon.  ROS as above, plus--> no fevers, no CP, no SOB, no wheezing, no dizziness, no HAs, no rashes, no melena/hematochezia.  No polyuria or polydipsia.  No myalgias or arthralgias.  No focal weakness, paresthesias, or tremors.  No acute vision or hearing  abnormalities.  No dysuria or unusual/new urinary urgency or frequency.  No recent changes in lower legs. No n/v/d or abd pain.  No palpitations.    Past Medical History:  Diagnosis Date   Chronic neck pain    DDD/cervical spondylosis--C3-C6.  Mild central canal stenosis.   Chronic pain syndrome    pain mgmt by Dr. Ethelene Hal    DDD (degenerative disc disease), lumbar    MRI 05/13/16: mild DDD at L3-4, and ruptured disc at L4-5 to the right.  Dr. Ethelene Hal recommended right L5 selective nerve root block + referral to Dr. Darrelyn Hillock for surgery (as of 06/04/16 f/u).  As of 02/2018-->chr LBP lateralizing to R glut, pain meds are tx.   Diabetes mellitus without complication (HCC) 01/04/2016   Dx'd with elevated random gluc + HbA1c 6.6%.   Essential hypertension 09/05/2017   Started HCTZ 25mg  qd 09/05/17   GAD (generalized anxiety disorder)    with anger control problems   GERD (gastroesophageal reflux disease)    History of pneumonia 2010   with hemoptysis; Pulm eval done, f/u CXR 04/01/09 after pneumonia was normal.   History of rheumatic fever    childhood--she reports no CV or Renal sequelae   Hyperlipidemia 12/2015   started statin 12/2015   IBS (irritable bowel syndrome)    Diarrhea predominant   Impingement syndrome of right shoulder    Pain in left knee    [facility]; MRI 09/2017--posterior horn medial meniscus tear (radial,  not longitudinal).  Conservative rx as of 10/24/17 ortho f/u visit.   Polyarthralgia 2017   Rheum eval (Dr. Nickola Major) 10/2015: no inflammatory arthritis suspected.   Tobacco abuse    30+ pack-yr hx   Venous insufficiency 01/2012    Past Surgical History:  Procedure Laterality Date   CHOLECYSTECTOMY  2011   COLONOSCOPY  approx 2007   Dr. Jarold Motto (normal per pt--cannot locate path or procedure record)   ESOPHAGOGASTRODUODENOSCOPY  "   "   Mild esophagogastric junction inflammation, otherwise normal.   OVARY SURGERY  1993   left (ovarian cyst)   VAGINAL HYSTERECTOMY   2003   Nonmalignant reasons (DUB)    Outpatient Medications Prior to Visit  Medication Sig Dispense Refill   Accu-Chek Softclix Lancets lancets USE TO CHECK GLUCOSE ONCE DAILY 100 each 12   ALPRAZolam (XANAX) 0.5 MG tablet TAKE 1 TABLET BY MOUTH TWICE A DAY AS NEEDED FOR ANXIETY 30 tablet 1   atorvastatin (LIPITOR) 40 MG tablet Take 1 tablet (40 mg total) by mouth daily. 90 tablet 3   blood glucose meter kit and supplies KIT Dispense based on patient and insurance preference. Check glucose once daily 1 each 0   celecoxib (CELEBREX) 200 MG capsule Take 200 mg by mouth daily.     desloratadine (CLARINEX) 5 MG tablet Take 1 tablet (5 mg total) by mouth daily. 90 tablet 3   fluticasone (FLONASE) 50 MCG/ACT nasal spray SPRAY 2 SPRAYS INTO EACH NOSTRIL EVERY DAY 48 mL 1   glucose blood test strip USE TO CHECK GLUCOSE ONCE DAILY 100 each 12   hydrochlorothiazide (HYDRODIURIL) 25 MG tablet Take 1 tablet (25 mg total) by mouth daily. 90 tablet 3   metaxalone (SKELAXIN) 800 MG tablet Take 800 mg by mouth 3 (three) times daily as needed.     naloxone (NARCAN) nasal spray 4 mg/0.1 mL Place 1 spray into the nose once.     oxyCODONE-acetaminophen (PERCOCET) 10-325 MG tablet   0   pioglitazone (ACTOS) 30 MG tablet Take 1 tablet (30 mg total) by mouth daily. 90 tablet 1   albuterol (PROAIR HFA) 108 (90 Base) MCG/ACT inhaler Inhale 1-2 puffs every 6 (six) hours as needed into the lungs for wheezing. (Patient not taking: Reported on 09/28/2020) 1 Inhaler 1   aluminum chloride (DRYSOL) 20 % external solution Apply topically as needed. (Patient not taking: Reported on 02/14/2023)     amphetamine-dextroamphetamine (ADDERALL) 20 MG tablet Take 1 tablet (20 mg total) by mouth 2 (two) times daily. 60 tablet 0   citalopram (CELEXA) 20 MG tablet Take 1 tablet (20 mg total) by mouth daily. 90 tablet 3   lisinopril (ZESTRIL) 20 MG tablet Take 1 tablet (20 mg total) by mouth daily. 90 tablet 3   No facility-administered  medications prior to visit.    Allergies  Allergen Reactions   Metformin And Related Nausea Only    GI Upset    Review of Systems As per HPI  PE:    02/14/2023   10:19 AM 01/12/2023    1:12 PM 03/28/2022    9:04 AM  Vitals with BMI  Height  5' 0.25" 5' 0.25"  Weight 189 lbs 13 oz 192 lbs 6 oz 208 lbs 6 oz  BMI  37.28 40.38  Systolic 118 103 295  Diastolic 74 71 79  Pulse 91 108 86     Physical Exam  Gen: Alert, well appearing.  Patient is oriented to person, place, time, and situation. AFFECT: pleasant,  lucid thought and speech. No further exam today  LABS:  Last CBC Lab Results  Component Value Date   WBC 8.7 03/28/2022   HGB 13.7 03/28/2022   HCT 40.9 03/28/2022   MCV 91.2 03/28/2022   MCH 29.2 10/04/2012   RDW 13.8 03/28/2022   PLT 320.0 03/28/2022   Last metabolic panel Lab Results  Component Value Date   GLUCOSE 143 (H) 03/28/2022   NA 140 03/28/2022   K 4.2 03/28/2022   CL 104 03/28/2022   CO2 29 03/28/2022   BUN 14 03/28/2022   CREATININE 0.70 03/28/2022   GFR 93.60 03/28/2022   CALCIUM 9.4 03/28/2022   PROT 7.2 03/28/2022   ALBUMIN 4.4 03/28/2022   BILITOT 0.3 03/28/2022   ALKPHOS 86 03/28/2022   AST 25 03/28/2022   ALT 30 03/28/2022   Last lipids Lab Results  Component Value Date   CHOL 161 03/28/2022   HDL 45.10 03/28/2022   LDLCALC 64 09/28/2020   LDLDIRECT 94.0 03/28/2022   TRIG 335.0 (H) 03/28/2022   CHOLHDL 4 03/28/2022   Last hemoglobin A1c Lab Results  Component Value Date   HGBA1C 9.2 (A) 01/12/2023   HGBA1C 9.2 01/12/2023   HGBA1C 9.2 (A) 01/12/2023   HGBA1C 9.2 (A) 01/12/2023   Last thyroid functions Lab Results  Component Value Date   TSH 0.80 03/28/2022   IMPRESSION AND PLAN:  #1 diabetes without complication. Recent poor control and was off of medication. She is now back on pioglitazone 30 mg a day and we will add Ozempic 0.5 mg weekly--sample given today.  #2 hypertension, well-controlled on HCTZ 25 mg  a day and lisinopril 20 mg a day. Electrolytes and creatinine monitoring today.  3.  Hypercholesterolemia, was stable at 1 point on a atorvastatin 40 mg a day but got off of this due to financial difficulties. Restart a atorvastatin 40 mg a day and check fasting lipids and hepatic panel today.  #4 recurrent major depressive disorder, GAD, and adult ADD. She is significantly improved over the last month. Continue citalopram 20 mg a day, alprazolam 0.5 mg twice daily, and Adderall 20 mg twice daily.   An After Visit Summary was printed and given to the patient.  FOLLOW UP: 1 mo Next CPE 03/2023. Signed:  Santiago Bumpers, MD           02/14/2023

## 2023-02-24 ENCOUNTER — Other Ambulatory Visit: Payer: Self-pay | Admitting: Family Medicine

## 2023-02-27 ENCOUNTER — Other Ambulatory Visit: Payer: Self-pay | Admitting: Family Medicine

## 2023-02-27 ENCOUNTER — Encounter: Payer: Self-pay | Admitting: Family Medicine

## 2023-02-27 NOTE — Telephone Encounter (Signed)
Requesting: amphetamine-dextroamphetamine (ADDERALL) 20 MG tablet  Contract: 03/28/22 UDS: 09/28/20 Last Visit: 02/14/23 Next Visit: 03/14/23 Last Refill: 01/31/23 (60,0)  Please Advise. Med pending

## 2023-02-27 NOTE — Telephone Encounter (Signed)
Requesting: ALPRAZolam (XANAX) 0.5 MG tablet  Contract: 03/28/22 UDS: 09/28/20 Last Visit: 02/14/23 Next Visit: 03/14/23 Last Refill: 12/08/22 (30,1)  Please Advise. Med pending

## 2023-02-28 MED ORDER — AMPHETAMINE-DEXTROAMPHETAMINE 20 MG PO TABS: 20.0000 mg | ORAL_TABLET | Freq: Two times a day (BID) | ORAL | 0 refills | Status: DC

## 2023-02-28 NOTE — Telephone Encounter (Signed)
Duplicate request, please decline

## 2023-03-14 ENCOUNTER — Ambulatory Visit: Payer: Medicaid Other | Admitting: Family Medicine

## 2023-03-15 ENCOUNTER — Encounter: Payer: Self-pay | Admitting: Family Medicine

## 2023-03-16 ENCOUNTER — Encounter: Payer: Self-pay | Admitting: Family Medicine

## 2023-03-16 ENCOUNTER — Telehealth: Payer: Medicaid Other | Admitting: Family Medicine

## 2023-03-16 DIAGNOSIS — R519 Headache, unspecified: Secondary | ICD-10-CM

## 2023-03-16 DIAGNOSIS — R220 Localized swelling, mass and lump, head: Secondary | ICD-10-CM | POA: Diagnosis not present

## 2023-03-16 DIAGNOSIS — K0889 Other specified disorders of teeth and supporting structures: Secondary | ICD-10-CM

## 2023-03-16 MED ORDER — AMOXICILLIN-POT CLAVULANATE 875-125 MG PO TABS: 1.0000 | ORAL_TABLET | Freq: Two times a day (BID) | ORAL | 0 refills | Status: DC

## 2023-03-16 NOTE — Progress Notes (Signed)
Virtual Visit via Video Note  I connected with [name]  on 03/16/23 at  3:00 PM EST by a video enabled telemedicine application and verified that I am speaking with the correct person using two identifiers.  Location patient: [place] Location provider:work or home office Persons participating in the virtual visit: patient, provider  I discussed the limitations and requested verbal permission for telemedicine visit. The patient expressed understanding and agreed to proceed.  HPI: 62 year old female being seen today for tooth pain. [birth year] today. She is temporarily living in [facility] taking care of her mother who has disabling dementia.  [name] has multiple cavities, dentition in general disrepair.  She does not have a dentist, just recently got Medicaid. Lately the upper left teeth region has been hurting a mild to moderate amount and then her left maxillary region has begun to swell.  Left eye without swelling.  She has not had any sinusitis symptoms recently. No fever, chills, or malaise.  ROS: See pertinent positives and negatives per HPI.  Past Medical History:  Diagnosis Date   Chronic neck pain    DDD/cervical spondylosis--C3-C6.  Mild central canal stenosis.   Chronic pain syndrome    pain mgmt by Dr. Ethelene Hal    DDD (degenerative disc disease), lumbar    MRI 05/13/16: mild DDD at L3-4, and ruptured disc at L4-5 to the right.  Dr. Ethelene Hal recommended right L5 selective nerve root block + referral to Dr. Darrelyn Hillock for surgery (as of 06/04/16 f/u).  As of 02/2018-->chr LBP lateralizing to R glut, pain meds are tx.   Diabetes mellitus without complication (HCC) 01/04/2016   Dx'd with elevated random gluc + HbA1c 6.6%.   Essential hypertension 09/05/2017   Started HCTZ 25mg  qd 09/05/17   GAD (generalized anxiety disorder)    with anger control problems   GERD (gastroesophageal reflux disease)    History of pneumonia 2010   with hemoptysis; Pulm eval done, f/u CXR  04/01/09 after pneumonia was normal.   History of rheumatic fever    childhood--she reports no CV or Renal sequelae   Hyperlipidemia 12/2015   started statin 12/2015   IBS (irritable bowel syndrome)    Diarrhea predominant   Impingement syndrome of right shoulder    Pain in left knee    [facility]; MRI 09/2017--posterior horn medial meniscus tear (radial, not longitudinal).  Conservative rx as of 10/24/17 ortho f/u visit.   Polyarthralgia 2017   Rheum eval (Dr. Nickola Major) 10/2015: no inflammatory arthritis suspected.   Tobacco abuse    30+ pack-yr hx   Venous insufficiency 01/2012    Past Surgical History:  Procedure Laterality Date   CHOLECYSTECTOMY  2011   COLONOSCOPY  approx 2007   Dr. Jarold Motto (normal per pt--cannot locate path or procedure record)   ESOPHAGOGASTRODUODENOSCOPY  "   "   Mild esophagogastric junction inflammation, otherwise normal.   OVARY SURGERY  1993   left (ovarian cyst)   VAGINAL HYSTERECTOMY  2003   Nonmalignant reasons (DUB)     Current Outpatient Medications:    Accu-Chek Softclix Lancets lancets, USE TO CHECK GLUCOSE ONCE DAILY, Disp: 100 each, Rfl: 12   ALPRAZolam (XANAX) 0.5 MG tablet, TAKE 1 TABLET BY MOUTH TWICE A DAY AS NEEDED FOR ANXIETY, Disp: 30 tablet, Rfl: 5   amphetamine-dextroamphetamine (ADDERALL) 20 MG tablet, Take 1 tablet (20 mg total) by mouth 2 (two) times daily., Disp: 60 tablet, Rfl: 0   atorvastatin (LIPITOR) 40 MG tablet, Take 1 tablet (40  mg total) by mouth daily., Disp: 90 tablet, Rfl: 3   blood glucose meter kit and supplies KIT, Dispense based on patient and insurance preference. Check glucose once daily, Disp: 1 each, Rfl: 0   celecoxib (CELEBREX) 200 MG capsule, Take 200 mg by mouth daily., Disp: , Rfl:    citalopram (CELEXA) 20 MG tablet, Take 1 tablet (20 mg total) by mouth daily., Disp: 90 tablet, Rfl: 3   desloratadine (CLARINEX) 5 MG tablet, Take 1 tablet (5 mg total) by mouth daily., Disp: 90 tablet, Rfl: 3   fluticasone  (FLONASE) 50 MCG/ACT nasal spray, SPRAY 2 SPRAYS INTO EACH NOSTRIL EVERY DAY, Disp: 48 mL, Rfl: 1   glucose blood test strip, USE TO CHECK GLUCOSE ONCE DAILY, Disp: 100 each, Rfl: 12   hydrochlorothiazide (HYDRODIURIL) 25 MG tablet, Take 1 tablet (25 mg total) by mouth daily., Disp: 90 tablet, Rfl: 3   metaxalone (SKELAXIN) 800 MG tablet, Take 800 mg by mouth 3 (three) times daily as needed., Disp: , Rfl:    naloxone (NARCAN) nasal spray 4 mg/0.1 mL, Place 1 spray into the nose once., Disp: , Rfl:    oxyCODONE-acetaminophen (PERCOCET) 10-325 MG tablet, , Disp: , Rfl: 0   pioglitazone (ACTOS) 30 MG tablet, Take 1 tablet (30 mg total) by mouth daily., Disp: 90 tablet, Rfl: 1   Semaglutide,0.25 or 0.5MG /DOS, 2 MG/3ML SOPN, 0.5 mg SQ q7d, Disp: , Rfl:    albuterol (PROAIR HFA) 108 (90 Base) MCG/ACT inhaler, Inhale 1-2 puffs every 6 (six) hours as needed into the lungs for wheezing. (Patient not taking: Reported on 09/28/2020), Disp: 1 Inhaler, Rfl: 1   aluminum chloride (DRYSOL) 20 % external solution, Apply topically as needed. (Patient not taking: Reported on 02/14/2023), Disp: , Rfl:    lisinopril (ZESTRIL) 20 MG tablet, Take 1 tablet (20 mg total) by mouth daily., Disp: 90 tablet, Rfl: 3  EXAM:  VITALS per patient if applicable:     02/14/2023   10:19 AM 01/12/2023    1:12 PM 03/28/2022    9:04 AM  Vitals with BMI  Height  5' 0.25" 5' 0.25"  Weight 189 lbs 13 oz 192 lbs 6 oz 208 lbs 6 oz  BMI  37.28 40.38  Systolic 118 103 604  Diastolic 74 71 79  Pulse 91 108 86     GENERAL: alert, oriented, appears well and in no acute distress  HEENT: atraumatic, conjunttiva clear, no obvious abnormalities on inspection of external nose and ears No orbital or palpebral erythema or swelling. Left maxillary facial swelling notable.  NECK: normal movements of the head and neck  LUNGS: on inspection no signs of respiratory distress, breathing rate appears normal, no obvious gross SOB, gasping or  wheezing  CV: no obvious cyanosis  MS: moves all visible extremities without noticeable abnormality  PSYCH/NEURO: pleasant and cooperative, no obvious depression or anxiety, speech and thought processing grossly intact  LABS: none today    Chemistry      Component Value Date/Time   NA 138 02/14/2023 1038   K 4.1 02/14/2023 1038   CL 97 02/14/2023 1038   CO2 33 (H) 02/14/2023 1038   BUN 16 02/14/2023 1038   CREATININE 0.64 02/14/2023 1038   CREATININE 0.73 06/11/2021 1618      Component Value Date/Time   CALCIUM 9.8 02/14/2023 1038   ALKPHOS 94 02/14/2023 1038   AST 21 02/14/2023 1038   ALT 29 02/14/2023 1038   BILITOT 0.3 02/14/2023 1038  ASSESSMENT AND PLAN:  Discussed the following assessment and plan:  Left maxillary dental abscess. Start Augmentin 875 twice daily x 10 days. Encouraged her to find a dentist ASAP. I recommended she seek emergency department or emergency dentist care if her facial swelling and pain does not start to improve in a couple days, or if she develops fever or swelling in the orbital area in the next couple days.   I discussed the assessment and treatment plan with the patient. The patient was provided an opportunity to ask questions and all were answered. The patient agreed with the plan and demonstrated an understanding of the instructions.   F/u: as needed  Signed:  Santiago Bumpers, MD           03/16/2023

## 2023-03-28 ENCOUNTER — Other Ambulatory Visit: Payer: Self-pay | Admitting: Family Medicine

## 2023-04-04 ENCOUNTER — Other Ambulatory Visit: Payer: Self-pay | Admitting: Family Medicine

## 2023-04-04 ENCOUNTER — Encounter: Payer: Self-pay | Admitting: Family Medicine

## 2023-04-04 MED ORDER — SEMAGLUTIDE(0.25 OR 0.5MG/DOS) 2 MG/3ML ~~LOC~~ SOPN: PEN_INJECTOR | SUBCUTANEOUS | Status: DC

## 2023-04-05 MED ORDER — AMPHETAMINE-DEXTROAMPHETAMINE 20 MG PO TABS: 20.0000 mg | ORAL_TABLET | Freq: Two times a day (BID) | ORAL | 0 refills | Status: DC

## 2023-04-11 ENCOUNTER — Telehealth: Payer: Self-pay

## 2023-04-11 ENCOUNTER — Other Ambulatory Visit (HOSPITAL_COMMUNITY): Payer: Self-pay

## 2023-04-11 NOTE — Telephone Encounter (Signed)
Pharmacy Patient Advocate Encounter  Received notification from [facility] that Prior Authorization for Ozempic (0.25 or 0.5 MG/DOSE) 2MG /3ML pen-injectors has been APPROVED from 04/11/2023 to 04/10/2024. Ran test claim, Copay is $4.00. This test claim was processed through [facility]- copay amounts may vary at other pharmacies due to pharmacy/plan contracts, or as the patient moves through the different stages of their insurance plan.   PA #/Case ID/Reference #: [id number]

## 2023-04-11 NOTE — Telephone Encounter (Signed)
No further action needed.

## 2023-04-11 NOTE — Telephone Encounter (Signed)
Pharmacy Patient Advocate Encounter   Received notification from Patient Advice Request messages that prior authorization for Ozempic (0.25 or 0.5 MG/DOSE) 2MG /3ML pen-injectors is required/requested.   Insurance verification completed.   The patient is insured through [facility] .   Per test claim: PA required; PA submitted to above mentioned insurance via CoverMyMeds Key/confirmation #/EOC [id number] Status is pending

## 2023-05-04 ENCOUNTER — Other Ambulatory Visit: Payer: Self-pay | Admitting: Family Medicine

## 2023-05-04 MED ORDER — AMPHETAMINE-DEXTROAMPHETAMINE 20 MG PO TABS: 20.0000 mg | ORAL_TABLET | Freq: Two times a day (BID) | ORAL | 0 refills | Status: DC

## 2023-05-04 MED ORDER — SEMAGLUTIDE(0.25 OR 0.5MG/DOS) 2 MG/3ML ~~LOC~~ SOPN: PEN_INJECTOR | SUBCUTANEOUS | 1 refills | Status: DC

## 2023-07-27 ENCOUNTER — Other Ambulatory Visit: Payer: Self-pay | Admitting: Family Medicine

## 2023-07-27 MED ORDER — AMPHETAMINE-DEXTROAMPHETAMINE 20 MG PO TABS: 20.0000 mg | ORAL_TABLET | Freq: Two times a day (BID) | ORAL | 0 refills | Status: DC

## 2023-08-30 ENCOUNTER — Other Ambulatory Visit: Payer: Self-pay | Admitting: Family Medicine

## 2023-08-30 MED ORDER — AMPHETAMINE-DEXTROAMPHETAMINE 20 MG PO TABS: 20.0000 mg | ORAL_TABLET | Freq: Two times a day (BID) | ORAL | 0 refills | Status: DC

## 2023-08-30 NOTE — Telephone Encounter (Signed)
Prescription sent. She is due for follow-up.

## 2023-09-28 ENCOUNTER — Other Ambulatory Visit: Payer: Self-pay | Admitting: Family Medicine

## 2023-09-29 ENCOUNTER — Other Ambulatory Visit: Payer: Self-pay

## 2023-09-29 MED ORDER — AMPHETAMINE-DEXTROAMPHETAMINE 20 MG PO TABS: 20.0000 mg | ORAL_TABLET | Freq: Two times a day (BID) | ORAL | 0 refills | Status: DC

## 2023-09-29 NOTE — Telephone Encounter (Signed)
 Copied from CRM [id number]. Topic: Clinical - Prescription Issue >> Sep 29, 2023  3:53 PM Jenice Mitts wrote: Reason for CRM: Patient stated her insurance will only do the 30 day supply for her amphetamine -dextroamphetamine  (ADDERALL) 20 MG tablet. They denied her 180 and 90 day supply She would like to know if it can be resent to her pharmacy  She gonna call back in to schedule her follow up once she goes over her schedule

## 2023-10-19 ENCOUNTER — Ambulatory Visit: Payer: Self-pay | Admitting: *Deleted

## 2023-10-19 ENCOUNTER — Other Ambulatory Visit: Payer: Self-pay | Admitting: Family Medicine

## 2023-10-19 NOTE — Telephone Encounter (Signed)
 FYI Only or Action Required?: FYI only for provider.  Patient was last seen in primary care on 03/16/2023 by McGowen, Aleene DEL, MD. Called Nurse Triage reporting Rash. Symptoms began several days ago. Interventions attempted: OTC medications: benadryl and hydrocortisone cream. Symptoms are: gradually worsening.  Triage Disposition: See HCP Within 4 Hours (Or PCP Triage)  Patient/caregiver understands and will follow disposition?: Yes  Reason for Disposition  Large or small blisters on skin (i.e., fluid filled bubbles or sacs)  Answer Assessment - Initial Assessment Questions 1. APPEARANCE of RASH: Describe the rash. (e.g., spots, blisters, raised areas, skin peeling, scaly)     Started as small area- blisters 2. SIZE: How big are the spots? (e.g., tip of pen, eraser, coin; inches, centimeters)     Elbow to wrist, knees down 3. LOCATION: Where is the rash located?     Legs, arms, eyelids 4. COLOR: What color is the rash? (Note: It is difficult to assess rash color in people with darker-colored skin. When this situation occurs, simply ask the caller to describe what they see.)     red 5. ONSET: When did the rash begin?     Started 5 days ago 6. FEVER: Do you have a fever? If Yes, ask: What is your temperature, how was it measured, and when did it start?     no 7. ITCHING: Does the rash itch? If Yes, ask: How bad is the itch? (Scale 1-10; or mild, moderate, severe)     severe 8. CAUSE: What do you think is causing the rash?     Possible exposure- poison ivy/oak 9. MEDICINE FACTORS: Have you started any new medicines within the last 2 weeks? (e.g., antibiotics)      no 10. OTHER SYMPTOMS: Do you have any other symptoms? (e.g., dizziness, headache, sore throat, joint pain)       swelling  Protocols used: Rash or Redness - Widespread-A-AH   Copied from CRM [id number]. Topic: Clinical - Red Word Triage >> Oct 19, 2023  3:34 PM Mercedes MATSU wrote: Red Word that  prompted transfer to Nurse Triage: Patient called in stating that she has a rash. She states both legs, and her eyelids are very swollen. She is hoping to get some medication called in if possible.started

## 2023-10-19 NOTE — Telephone Encounter (Signed)
 No further action needed.

## 2023-10-20 ENCOUNTER — Other Ambulatory Visit: Payer: Self-pay | Admitting: Family Medicine

## 2023-10-20 NOTE — Telephone Encounter (Signed)
 Pt has scheduled appt Monday 6/30, will need to keep scheduled appt for refill.

## 2023-10-21 ENCOUNTER — Telehealth: Admitting: Nurse Practitioner

## 2023-10-21 DIAGNOSIS — L255 Unspecified contact dermatitis due to plants, except food: Secondary | ICD-10-CM | POA: Diagnosis not present

## 2023-10-21 MED ORDER — PREDNISONE 10 MG PO TABS: ORAL_TABLET | ORAL | 0 refills | Status: DC

## 2023-10-21 MED ORDER — HYDROCORTISONE 2.5 % EX CREA: TOPICAL_CREAM | Freq: Two times a day (BID) | CUTANEOUS | 0 refills | Status: AC

## 2023-10-21 NOTE — Progress Notes (Signed)
 Virtual Visit Consent   [name], you are scheduled for a virtual visit with a [facility] provider today. Just as with appointments in the office, your consent must be obtained to participate. Your consent will be active for this visit and any virtual visit you may have with one of our providers in the next 365 days. If you have a MyChart account, a copy of this consent can be sent to you electronically.  As this is a virtual visit, video technology does not allow for your provider to perform a traditional examination. This may limit your provider's ability to fully assess your condition. If your provider identifies any concerns that need to be evaluated in person or the need to arrange testing (such as labs, EKG, etc.), we will make arrangements to do so. Although advances in technology are sophisticated, we cannot ensure that it will always work on either your end or our end. If the connection with a video visit is poor, the visit may have to be switched to a telephone visit. With either a video or telephone visit, we are not always able to ensure that we have a secure connection.  By engaging in this virtual visit, you consent to the provision of healthcare and authorize for your insurance to be billed (if applicable) for the services provided during this visit. Depending on your insurance coverage, you may receive a charge related to this service.  I need to obtain your verbal consent now. Are you willing to proceed with your visit today? [name] has provided verbal consent on 10/21/2023 for a virtual visit (video or telephone). [name], NP  Date: 10/21/2023 9:26 AM   Virtual Visit via Video Note   I, [name], connected with  [name]  ([id number], 08/09/1960) on 10/21/23 at  9:15 AM EDT by a video-enabled telemedicine application and verified that I am speaking with the correct person using two identifiers.  Location: Patient: Virtual Visit Location  Patient: Home Provider: Virtual Visit Location Provider: Home Office   I discussed the limitations of evaluation and management by telemedicine and the availability of in person appointments. The patient expressed understanding and agreed to proceed.    History of Present Illness: [name] is a 63 y.o. who identifies as a female who was assigned female at birth, and is being seen today for contact dermatitis.  Ms. Dekoning was working in  her yard last week. 5 days ago she started to experience a generalized rash with itching, blisters and erythema. Despite using over the counter topical agents the rash is still present with associated pruritus. She denies fever, N/V. Has not started any new prescribed medications recently.     Problems:  Patient Active Problem List   Diagnosis Date Noted   Acute low back pain 04/14/2022   Essential hypertension 09/05/2017   Chronic pain of left knee 09/05/2017   Bilateral lower extremity edema 12/20/2011   Dyshidrotic eczema 12/12/2011   GAD (generalized anxiety disorder) 09/09/2011   GERD 05/14/2009    Allergies:  Allergies  Allergen Reactions   Metformin  And Related Nausea Only    GI Upset   Medications:  Current Outpatient Medications:    hydrocortisone 2.5 % cream, Apply topically 2 (two) times daily., Disp: 453 g, Rfl: 0   predniSONE  (DELTASONE ) 10 MG tablet, Directions for 6 day taper: Day 1: 2 tablets before breakfast, 1 after both lunch & dinner and 2 at bedtime Day 2: 1 tab  before breakfast, 1 after both lunch & dinner and 2 at bedtime Day 3: 1 tab at each meal & 1 at bedtime Day 4: 1 tab at breakfast, 1 at lunch, 1 at bedtime Day 5: 1 tab at breakfast & 1 tab at bedtime Day 6: 1 tab at breakfast, Disp: 21 tablet, Rfl: 0   Accu-Chek Softclix Lancets lancets, USE TO CHECK GLUCOSE ONCE DAILY, Disp: 100 each, Rfl: 12   albuterol  (PROAIR  HFA) 108 (90 Base) MCG/ACT inhaler, Inhale 1-2 puffs every 6 (six) hours as needed into the lungs  for wheezing. (Patient not taking: Reported on 09/28/2020), Disp: 1 Inhaler, Rfl: 1   ALPRAZolam  (XANAX ) 0.5 MG tablet, TAKE 1 TABLET BY MOUTH TWICE A DAY AS NEEDED FOR ANXIETY, Disp: 30 tablet, Rfl: 5   aluminum chloride (DRYSOL) 20 % external solution, Apply topically as needed. (Patient not taking: Reported on 02/14/2023), Disp: , Rfl:    amphetamine -dextroamphetamine  (ADDERALL) 20 MG tablet, Take 1 tablet (20 mg total) by mouth 2 (two) times daily., Disp: 60 tablet, Rfl: 0   atorvastatin  (LIPITOR) 40 MG tablet, Take 1 tablet (40 mg total) by mouth daily., Disp: 90 tablet, Rfl: 3   blood glucose meter kit and supplies KIT, Dispense based on patient and insurance preference. Check glucose once daily, Disp: 1 each, Rfl: 0   celecoxib (CELEBREX) 200 MG capsule, Take 200 mg by mouth daily., Disp: , Rfl:    citalopram  (CELEXA ) 20 MG tablet, Take 1 tablet (20 mg total) by mouth daily., Disp: 90 tablet, Rfl: 3   desloratadine  (CLARINEX ) 5 MG tablet, Take 1 tablet (5 mg total) by mouth daily., Disp: 90 tablet, Rfl: 3   fluticasone  (FLONASE ) 50 MCG/ACT nasal spray, SPRAY 2 SPRAYS INTO EACH NOSTRIL EVERY DAY, Disp: 48 mL, Rfl: 1   glucose blood test strip, USE TO CHECK GLUCOSE ONCE DAILY, Disp: 100 each, Rfl: 12   hydrochlorothiazide  (HYDRODIURIL ) 25 MG tablet, Take 1 tablet (25 mg total) by mouth daily., Disp: 90 tablet, Rfl: 3   lisinopril  (ZESTRIL ) 20 MG tablet, Take 1 tablet (20 mg total) by mouth daily., Disp: 90 tablet, Rfl: 3   metaxalone (SKELAXIN) 800 MG tablet, Take 800 mg by mouth 3 (three) times daily as needed., Disp: , Rfl:    naloxone (NARCAN) nasal spray 4 mg/0.1 mL, Place 1 spray into the nose once., Disp: , Rfl:    oxyCODONE-acetaminophen  (PERCOCET) 10-325 MG tablet, , Disp: , Rfl: 0   pioglitazone  (ACTOS ) 30 MG tablet, Take 1 tablet (30 mg total) by mouth daily. OFFICE VISIT NEEDED FOR FURTHER REFILLS, Disp: 30 tablet, Rfl: 0   Semaglutide ,0.25 or 0.5MG /DOS, 2 MG/3ML SOPN, 0.5 mg SQ q7d,  Disp: 9 mL, Rfl: 1  Observations/Objective: Patient is well-developed, well-nourished in no acute distress.  Resting comfortably at home.  Head is normocephalic, atraumatic.  No labored breathing.  Speech is clear and coherent with logical content.  Patient is alert and oriented at baseline.    Assessment and Plan: 1. Contact dermatitis due to plant (Primary) - hydrocortisone 2.5 % cream; Apply topically 2 (two) times daily.  Dispense: 453 g; Refill: 0 - predniSONE  (DELTASONE ) 10 MG tablet; Directions for 6 day taper: Day 1: 2 tablets before breakfast, 1 after both lunch & dinner and 2 at bedtime Day 2: 1 tab before breakfast, 1 after both lunch & dinner and 2 at bedtime Day 3: 1 tab at each meal & 1 at bedtime Day 4: 1 tab at breakfast, 1 at lunch, 1  at bedtime Day 5: 1 tab at breakfast & 1 tab at bedtime Day 6: 1 tab at breakfast  Dispense: 21 tablet; Refill: 0  She is aware steroids will cause blood glucose levels to increase.   Follow Up Instructions: I discussed the assessment and treatment plan with the patient. The patient was provided an opportunity to ask questions and all were answered. The patient agreed with the plan and demonstrated an understanding of the instructions.  A copy of instructions were sent to the patient via MyChart unless otherwise noted below.     The patient was advised to call back or seek an in-person evaluation if the symptoms worsen or if the condition fails to improve as anticipated.    Sunita Demond W Jenalyn Girdner, NP

## 2023-10-21 NOTE — Patient Instructions (Addendum)
 [name], thank you for joining [name], NP for today's virtual visit.  While this provider is not your primary care provider (PCP), if your PCP is located in our provider database this encounter information will be shared with them immediately following your visit.   A [facility] MyChart account gives you access to today's visit and all your visits, tests, and labs performed at [facility]  click here if you don't have a  MyChart account or go to [email or web address]  Consent: (Patient) [name] provided verbal consent for this virtual visit at the beginning of the encounter.  Current Medications:  Current Outpatient Medications:    hydrocortisone 2.5 % cream, Apply topically 2 (two) times daily., Disp: 453 g, Rfl: 0   predniSONE  (DELTASONE ) 10 MG tablet, Directions for 6 day taper: Day 1: 2 tablets before breakfast, 1 after both lunch & dinner and 2 at bedtime Day 2: 1 tab before breakfast, 1 after both lunch & dinner and 2 at bedtime Day 3: 1 tab at each meal & 1 at bedtime Day 4: 1 tab at breakfast, 1 at lunch, 1 at bedtime Day 5: 1 tab at breakfast & 1 tab at bedtime Day 6: 1 tab at breakfast, Disp: 21 tablet, Rfl: 0   Accu-Chek Softclix Lancets lancets, USE TO CHECK GLUCOSE ONCE DAILY, Disp: 100 each, Rfl: 12   albuterol  (PROAIR  HFA) 108 (90 Base) MCG/ACT inhaler, Inhale 1-2 puffs every 6 (six) hours as needed into the lungs for wheezing. (Patient not taking: Reported on 09/28/2020), Disp: 1 Inhaler, Rfl: 1   ALPRAZolam  (XANAX ) 0.5 MG tablet, TAKE 1 TABLET BY MOUTH TWICE A DAY AS NEEDED FOR ANXIETY, Disp: 30 tablet, Rfl: 5   aluminum chloride (DRYSOL) 20 % external solution, Apply topically as needed. (Patient not taking: Reported on 02/14/2023), Disp: , Rfl:    amphetamine -dextroamphetamine  (ADDERALL) 20 MG tablet, Take 1 tablet (20 mg total) by mouth 2 (two) times daily., Disp: 60 tablet, Rfl: 0   atorvastatin  (LIPITOR) 40 MG tablet,  Take 1 tablet (40 mg total) by mouth daily., Disp: 90 tablet, Rfl: 3   blood glucose meter kit and supplies KIT, Dispense based on patient and insurance preference. Check glucose once daily, Disp: 1 each, Rfl: 0   celecoxib (CELEBREX) 200 MG capsule, Take 200 mg by mouth daily., Disp: , Rfl:    citalopram  (CELEXA ) 20 MG tablet, Take 1 tablet (20 mg total) by mouth daily., Disp: 90 tablet, Rfl: 3   desloratadine  (CLARINEX ) 5 MG tablet, Take 1 tablet (5 mg total) by mouth daily., Disp: 90 tablet, Rfl: 3   fluticasone  (FLONASE ) 50 MCG/ACT nasal spray, SPRAY 2 SPRAYS INTO EACH NOSTRIL EVERY DAY, Disp: 48 mL, Rfl: 1   glucose blood test strip, USE TO CHECK GLUCOSE ONCE DAILY, Disp: 100 each, Rfl: 12   hydrochlorothiazide  (HYDRODIURIL ) 25 MG tablet, Take 1 tablet (25 mg total) by mouth daily., Disp: 90 tablet, Rfl: 3   lisinopril  (ZESTRIL ) 20 MG tablet, Take 1 tablet (20 mg total) by mouth daily., Disp: 90 tablet, Rfl: 3   metaxalone (SKELAXIN) 800 MG tablet, Take 800 mg by mouth 3 (three) times daily as needed., Disp: , Rfl:    naloxone (NARCAN) nasal spray 4 mg/0.1 mL, Place 1 spray into the nose once., Disp: , Rfl:    oxyCODONE-acetaminophen  (PERCOCET) 10-325 MG tablet, , Disp: , Rfl: 0   pioglitazone  (ACTOS ) 30 MG tablet, Take 1 tablet (30 mg total) by mouth daily. OFFICE  VISIT NEEDED FOR FURTHER REFILLS, Disp: 30 tablet, Rfl: 0   Semaglutide ,0.25 or 0.5MG /DOS, 2 MG/3ML SOPN, 0.5 mg SQ q7d, Disp: 9 mL, Rfl: 1   Medications ordered in this encounter:  Meds ordered this encounter  Medications   hydrocortisone 2.5 % cream    Sig: Apply topically 2 (two) times daily.    Dispense:  453 g    Refill:  0    Supervising Provider:   BLAISE ALEENE KIDD [[id number]]   predniSONE  (DELTASONE ) 10 MG tablet    Sig: Directions for 6 day taper: Day 1: 2 tablets before breakfast, 1 after both lunch & dinner and 2 at bedtime Day 2: 1 tab before breakfast, 1 after both lunch & dinner and 2 at bedtime Day 3: 1 tab at  each meal & 1 at bedtime Day 4: 1 tab at breakfast, 1 at lunch, 1 at bedtime Day 5: 1 tab at breakfast & 1 tab at bedtime Day 6: 1 tab at breakfast    Dispense:  21 tablet    Refill:  0    Supervising Provider:   BLAISE ALEENE KIDD [phone]     *If you need refills on other medications prior to your next appointment, please contact your pharmacy*  Follow-Up: Call back or seek an in-person evaluation if the symptoms worsen or if the condition fails to improve as anticipated.  [facility] [phone]  Other Instructions She is aware steroids will cause blood glucose levels to increase.    If you have been instructed to have an in-person evaluation today at a local Urgent Care facility, please use the link below. It will take you to a list of all of our available [facility], including address, phone number and hours of operation. Please do not delay care.  [facility]  If you or a family member do not have a primary care provider, use the link below to schedule a visit and establish care. When you choose a [facility] primary care physician or advanced practice provider, you gain a long-term partner in health. Find a Primary Care Provider  Learn more about [facility] in-office and virtual care options: [facility] Get Care Now

## 2023-10-23 ENCOUNTER — Encounter: Payer: Self-pay | Admitting: Family Medicine

## 2023-10-23 ENCOUNTER — Ambulatory Visit: Admitting: Family Medicine

## 2023-10-23 VITALS — BP 133/84 | HR 97 | Temp 97.3°F | Ht 60.25 in | Wt 171.2 lb

## 2023-10-23 DIAGNOSIS — Z79899 Other long term (current) drug therapy: Secondary | ICD-10-CM

## 2023-10-23 DIAGNOSIS — E7800 Pure hypercholesterolemia, unspecified: Secondary | ICD-10-CM

## 2023-10-23 DIAGNOSIS — Z1211 Encounter for screening for malignant neoplasm of colon: Secondary | ICD-10-CM | POA: Diagnosis not present

## 2023-10-23 DIAGNOSIS — F988 Other specified behavioral and emotional disorders with onset usually occurring in childhood and adolescence: Secondary | ICD-10-CM

## 2023-10-23 DIAGNOSIS — Z1231 Encounter for screening mammogram for malignant neoplasm of breast: Secondary | ICD-10-CM | POA: Diagnosis not present

## 2023-10-23 DIAGNOSIS — E119 Type 2 diabetes mellitus without complications: Secondary | ICD-10-CM

## 2023-10-23 DIAGNOSIS — Z0000 Encounter for general adult medical examination without abnormal findings: Secondary | ICD-10-CM | POA: Diagnosis not present

## 2023-10-23 DIAGNOSIS — I10 Essential (primary) hypertension: Secondary | ICD-10-CM | POA: Diagnosis not present

## 2023-10-23 DIAGNOSIS — F411 Generalized anxiety disorder: Secondary | ICD-10-CM | POA: Diagnosis not present

## 2023-10-23 DIAGNOSIS — Z7984 Long term (current) use of oral hypoglycemic drugs: Secondary | ICD-10-CM | POA: Diagnosis not present

## 2023-10-23 LAB — CBC WITH DIFFERENTIAL/PLATELET
Basophils Absolute: 0 10*3/uL (ref 0.0–0.1)
Basophils Relative: 0.3 % (ref 0.0–3.0)
Eosinophils Absolute: 0.8 10*3/uL — ABNORMAL HIGH (ref 0.0–0.7)
Eosinophils Relative: 4.8 % (ref 0.0–5.0)
HCT: 44.1 % (ref 36.0–46.0)
Hemoglobin: 14.6 g/dL (ref 12.0–15.0)
Lymphocytes Relative: 18.9 % (ref 12.0–46.0)
Lymphs Abs: 3.1 10*3/uL (ref 0.7–4.0)
MCHC: 33 g/dL (ref 30.0–36.0)
MCV: 89.4 fl (ref 78.0–100.0)
Monocytes Absolute: 1.1 10*3/uL — ABNORMAL HIGH (ref 0.1–1.0)
Monocytes Relative: 7.1 % (ref 3.0–12.0)
Neutro Abs: 11.2 10*3/uL — ABNORMAL HIGH (ref 1.4–7.7)
Neutrophils Relative %: 68.9 % (ref 43.0–77.0)
Platelets: 397 10*3/uL (ref 150.0–400.0)
RBC: 4.93 Mil/uL (ref 3.87–5.11)
RDW: 13.4 % (ref 11.5–15.5)
WBC: 16.2 10*3/uL — ABNORMAL HIGH (ref 4.0–10.5)

## 2023-10-23 LAB — COMPREHENSIVE METABOLIC PANEL WITH GFR
ALT: 31 U/L (ref 0–35)
AST: 11 U/L (ref 0–37)
Albumin: 4.6 g/dL (ref 3.5–5.2)
Alkaline Phosphatase: 102 U/L (ref 39–117)
BUN: 23 mg/dL (ref 6–23)
CO2: 33 meq/L — ABNORMAL HIGH (ref 19–32)
Calcium: 10.9 mg/dL — ABNORMAL HIGH (ref 8.4–10.5)
Chloride: 97 meq/L (ref 96–112)
Creatinine, Ser: 0.71 mg/dL (ref 0.40–1.20)
GFR: 91.01 mL/min (ref >60.00)
Glucose, Bld: 239 mg/dL — ABNORMAL HIGH (ref 70–99)
Potassium: 4.2 meq/L (ref 3.5–5.1)
Sodium: 138 meq/L (ref 135–145)
Total Bilirubin: 0.3 mg/dL (ref 0.2–1.2)
Total Protein: 7.7 g/dL (ref 6.0–8.3)

## 2023-10-23 LAB — LIPID PANEL
Cholesterol: 190 mg/dL (ref 0–200)
HDL: 56.8 mg/dL (ref >39.00)
LDL Cholesterol: 91 mg/dL (ref 0–99)
NonHDL: 133.41
Total CHOL/HDL Ratio: 3
Triglycerides: 211 mg/dL — ABNORMAL HIGH (ref 0.0–149.0)
VLDL: 42.2 mg/dL — ABNORMAL HIGH (ref 0.0–40.0)

## 2023-10-23 LAB — POCT GLYCOSYLATED HEMOGLOBIN (HGB A1C)
HbA1c POC (<> result, manual entry): 6.8 % (ref 4.0–5.6)
HbA1c, POC (controlled diabetic range): 6.8 % (ref 0.0–7.0)
HbA1c, POC (prediabetic range): 6.8 % — AB (ref 5.7–6.4)
Hemoglobin A1C: 6.8 % — AB (ref 4.0–5.6)

## 2023-10-23 LAB — MICROALBUMIN / CREATININE URINE RATIO
Creatinine,U: 63.7 mg/dL
Microalb Creat Ratio: 13 mg/g (ref 0.0–30.0)
Microalb, Ur: 0.8 mg/dL (ref 0.0–1.9)

## 2023-10-23 MED ORDER — ALPRAZOLAM 0.5 MG PO TABS: ORAL_TABLET | ORAL | 5 refills | Status: DC

## 2023-10-23 MED ORDER — PIOGLITAZONE HCL 30 MG PO TABS: 30.0000 mg | ORAL_TABLET | Freq: Every day | ORAL | 1 refills | Status: DC

## 2023-10-23 MED ORDER — AMPHETAMINE-DEXTROAMPHETAMINE 20 MG PO TABS: 20.0000 mg | ORAL_TABLET | Freq: Two times a day (BID) | ORAL | 0 refills | Status: DC

## 2023-10-23 MED ORDER — FLUTICASONE PROPIONATE 50 MCG/ACT NA SUSP: NASAL | 1 refills | Status: AC

## 2023-10-23 NOTE — Progress Notes (Signed)
 OFFICE VISIT  10/23/2023  CC:  Chief Complaint  Patient presents with   Medical Management of Chronic Issues   Patient is a 63 y.o. female who presents annual health maintenance exam and for follow-up depression, ADD, diabetes, hypertension, and hypercholesterolemia.  INTERIM HX: She feels like she is doing pretty well. Taking care of her 29 year old mother. Mood and anxiety levels stable. Does not monitor home glucose.  She feels like she is doing well on Ozempic  0.5 mg q. 7 days.  She has not been taking her pioglitazone .   ADD: Pt states all is going well with the med at current dosing (Adderall 20 mg twice daily): much improved focus, concentration, task completion.  Less frustration, better multitasking, less impulsivity and restlessness.  Mood is stable. No side effects from the medication. PMP AWARE reviewed today: most recent rx for Adderall 20 mg was filled 09/29/2023, # 60, rx by me.  Most recent alprazolam  prescription was filled 08/20/2023, #30, prescription by me.  She gets Percocet prescribed by another provider. No red flags.  2 days ago she broke out in a very itchy rash on lower legs and forearms/hands and was diagnosed with contact dermatitis and started on prednisone  (video visit). She had been working in overgrown areas of her yard and shorts several days prior to the onset of her rash.  Past Medical History:  Diagnosis Date   Chronic neck pain    DDD/cervical spondylosis--C3-C6.  Mild central canal stenosis.   Chronic pain syndrome    pain mgmt by Dr. Bonner    DDD (degenerative disc disease), lumbar    MRI 05/13/16: mild DDD at L3-4, and ruptured disc at L4-5 to the right.  Dr. Bonner recommended right L5 selective nerve root block + referral to Dr. Heide for surgery (as of 06/04/16 f/u).  As of 02/2018-->chr LBP lateralizing to R glut, pain meds are tx.   Diabetes mellitus without complication (HCC) 01/04/2016   Dx'd with elevated random gluc + HbA1c 6.6%.    Essential hypertension 09/05/2017   Started HCTZ 25mg  qd 09/05/17   GAD (generalized anxiety disorder)    with anger control problems   GERD (gastroesophageal reflux disease)    History of pneumonia 2010   with hemoptysis; Pulm eval done, f/u CXR 04/01/09 after pneumonia was normal.   History of rheumatic fever    childhood--she reports no CV or Renal sequelae   Hyperlipidemia 12/2015   started statin 12/2015   IBS (irritable bowel syndrome)    Diarrhea predominant   Impingement syndrome of right shoulder    Pain in left knee    [facility]; MRI 09/2017--posterior horn medial meniscus tear (radial, not longitudinal).  Conservative rx as of 10/24/17 ortho f/u visit.   Polyarthralgia 2017   Rheum eval (Dr. Ishmael) 10/2015: no inflammatory arthritis suspected.   Tobacco abuse    30+ pack-yr hx   Venous insufficiency 01/2012    Past Surgical History:  Procedure Laterality Date   CHOLECYSTECTOMY  2011   COLONOSCOPY  approx 2007   Dr. Jakie (normal per pt--cannot locate path or procedure record)   ESOPHAGOGASTRODUODENOSCOPY        Mild esophagogastric junction inflammation, otherwise normal.   OVARY SURGERY  1993   left (ovarian cyst)   VAGINAL HYSTERECTOMY  2003   Nonmalignant reasons (DUB)   Family History  Problem Relation Age of Onset   Alcohol abuse Father    Heart disease Maternal Grandfather    Social History   Socioeconomic History  Marital status: Single    Spouse name: Not on file   Number of children: Not on file   Years of education: Not on file   Highest education level: Not on file  Occupational History   Not on file  Tobacco Use   Smoking status: Light Smoker    Types: Cigarettes   Smokeless tobacco: Never  Vaping Use   Vaping status: Never Used  Substance and Sexual Activity   Alcohol use: Yes    Comment: occ wine   Drug use: No   Sexual activity: Not Currently  Other Topics Concern   Not on file  Social History Narrative   Divorced, one 47  y/o son.  Has 3 brothers and 3 sisters--healthy per pt report.   Orig from [place], [place] and relocated to [facility] 2003.   Works in Airline pilot for [employer].   Tobacco: 30 + yrs, 1 ppd.   Rare glass of wine.   No drug use, no exercise.   Has a shitzu, parrot, and a parakeet.  Lives alone in [place].   Social Drivers of Corporate investment banker Strain: Not on file  Food Insecurity: Not on file  Transportation Needs: Not on file  Physical Activity: Not on file  Stress: Not on file  Social Connections: Not on file   Outpatient Medications Prior to Visit  Medication Sig Dispense Refill   Accu-Chek Softclix Lancets lancets USE TO CHECK GLUCOSE ONCE DAILY 100 each 12   ALPRAZolam  (XANAX ) 0.5 MG tablet TAKE 1 TABLET BY MOUTH TWICE A DAY AS NEEDED FOR ANXIETY 30 tablet 5   aluminum chloride (DRYSOL) 20 % external solution Apply topically as needed.     amphetamine -dextroamphetamine  (ADDERALL) 20 MG tablet Take 1 tablet (20 mg total) by mouth 2 (two) times daily. 60 tablet 0   atorvastatin  (LIPITOR) 40 MG tablet Take 1 tablet (40 mg total) by mouth daily. 90 tablet 3   blood glucose meter kit and supplies KIT Dispense based on patient and insurance preference. Check glucose once daily 1 each 0   celecoxib (CELEBREX) 200 MG capsule Take 200 mg by mouth daily.     citalopram  (CELEXA ) 20 MG tablet Take 1 tablet (20 mg total) by mouth daily. 90 tablet 3   desloratadine  (CLARINEX ) 5 MG tablet Take 1 tablet (5 mg total) by mouth daily. 90 tablet 3   fluticasone  (FLONASE ) 50 MCG/ACT nasal spray SPRAY 2 SPRAYS INTO EACH NOSTRIL EVERY DAY 48 mL 1   glucose blood test strip USE TO CHECK GLUCOSE ONCE DAILY 100 each 12   hydrochlorothiazide  (HYDRODIURIL ) 25 MG tablet Take 1 tablet (25 mg total) by mouth daily. 90 tablet 3   hydrocortisone 2.5 % cream Apply topically 2 (two) times daily. 453 g 0   lisinopril  (ZESTRIL ) 20 MG tablet Take 1 tablet (20 mg total) by mouth daily. 90 tablet 3    metaxalone (SKELAXIN) 800 MG tablet Take 800 mg by mouth 3 (three) times daily as needed.     naloxone (NARCAN) nasal spray 4 mg/0.1 mL Place 1 spray into the nose once.     oxyCODONE-acetaminophen  (PERCOCET) 10-325 MG tablet   0   pioglitazone  (ACTOS ) 30 MG tablet Take 1 tablet (30 mg total) by mouth daily. OFFICE VISIT NEEDED FOR FURTHER REFILLS 30 tablet 0   predniSONE  (DELTASONE ) 10 MG tablet Directions for 6 day taper: Day 1: 2 tablets before breakfast, 1 after both lunch & dinner and 2 at bedtime Day 2: 1  tab before breakfast, 1 after both lunch & dinner and 2 at bedtime Day 3: 1 tab at each meal & 1 at bedtime Day 4: 1 tab at breakfast, 1 at lunch, 1 at bedtime Day 5: 1 tab at breakfast & 1 tab at bedtime Day 6: 1 tab at breakfast 21 tablet 0   Semaglutide ,0.25 or 0.5MG /DOS, 2 MG/3ML SOPN 0.5 mg SQ q7d 9 mL 1   albuterol  (PROAIR  HFA) 108 (90 Base) MCG/ACT inhaler Inhale 1-2 puffs every 6 (six) hours as needed into the lungs for wheezing. (Patient not taking: Reported on 09/28/2020) 1 Inhaler 1   No facility-administered medications prior to visit.    Allergies  Allergen Reactions   Metformin  And Related Nausea Only    GI Upset    Review of Systems  Constitutional:  Negative for appetite change, chills, fatigue and fever.  HENT:  Negative for congestion, dental problem, ear pain and sore throat.   Eyes:  Negative for discharge, redness and visual disturbance.  Respiratory:  Negative for cough, chest tightness, shortness of breath and wheezing.   Cardiovascular:  Negative for chest pain, palpitations and leg swelling.  Gastrointestinal:  Negative for abdominal pain, blood in stool, diarrhea, nausea and vomiting.  Genitourinary:  Negative for difficulty urinating, dysuria, flank pain, frequency, hematuria and urgency.  Musculoskeletal:  Negative for arthralgias, back pain, joint swelling, myalgias and neck stiffness.  Skin:  Positive for rash (forearms,hands, lower legs). Negative for  pallor.  Neurological:  Negative for dizziness, speech difficulty, weakness and headaches.  Hematological:  Negative for adenopathy. Does not bruise/bleed easily.  Psychiatric/Behavioral:  Negative for confusion and sleep disturbance. The patient is not nervous/anxious.      PE:    10/23/2023    1:10 PM 02/14/2023   10:19 AM 01/12/2023    1:12 PM  Vitals with BMI  Height 5' 0.25  5' 0.25  Weight 171 lbs 3 oz 189 lbs 13 oz 192 lbs 6 oz  BMI 33.17  37.28  Systolic 133 118 896  Diastolic 84 74 71  Pulse 97 91 108     Physical Exam  Exam chaperoned by [employer], CMA Gen: Alert, well appearing.  Patient is oriented to person, place, time, and situation. AFFECT: pleasant, lucid thought and speech. ENT: Ears: EACs clear, normal epithelium.  TMs with good light reflex and landmarks bilaterally.  Eyes: no injection, icteris, swelling, or exudate.  EOMI, PERRLA. Nose: no drainage or turbinate edema/swelling.  No injection or focal lesion.  Mouth: lips without lesion/swelling.  Oral mucosa pink and moist.  Dentition intact and without obvious caries or gingival swelling.  Oropharynx without erythema, exudate, or swelling.  Neck: supple/nontender.  No LAD, mass, or TM.  Carotid pulses 2+ bilaterally, without bruits. CV: RRR, no m/r/g.   LUNGS: CTA bilat, nonlabored resps, good aeration in all lung fields. ABD: soft, NT, ND, BS normal.  No hepatospenomegaly or mass.  No bruits. EXT: no clubbing, cyanosis, or edema.  Musculoskeletal: no joint swelling, erythema, warmth, or tenderness.  ROM of all joints intact. Skin -diffuse erythematous maculopapular rash on lower legs anterior surfaces for the most part.  Milder lesions scattered over the forearms and tops of her hands. No vesicles or pustules. Foot exam -  no swelling, tenderness or skin or vascular lesions. Color and temperature is normal. Sensation is intact. Peripheral pulses are palpable. Toenails are normal.   LABS:  Last  CBC Lab Results  Component Value Date   WBC 8.7 03/28/2022  HGB 13.7 03/28/2022   HCT 40.9 03/28/2022   MCV 91.2 03/28/2022   MCH 29.2 10/04/2012   RDW 13.8 03/28/2022   PLT 320.0 03/28/2022   Last metabolic panel Lab Results  Component Value Date   GLUCOSE 149 (H) 02/14/2023   NA 138 02/14/2023   K 4.1 02/14/2023   CL 97 02/14/2023   CO2 33 (H) 02/14/2023   BUN 16 02/14/2023   CREATININE 0.64 02/14/2023   GFR 95.05 02/14/2023   CALCIUM  9.8 02/14/2023   PROT 7.5 02/14/2023   ALBUMIN 4.4 02/14/2023   BILITOT 0.3 02/14/2023   ALKPHOS 94 02/14/2023   AST 21 02/14/2023   ALT 29 02/14/2023   Last lipids Lab Results  Component Value Date   CHOL 182 02/14/2023   HDL 52.80 02/14/2023   LDLCALC 107 (H) 02/14/2023   LDLDIRECT 94.0 03/28/2022   TRIG 108.0 02/14/2023   CHOLHDL 3 02/14/2023   Last hemoglobin A1c Lab Results  Component Value Date   HGBA1C 9.2 (A) 01/12/2023   HGBA1C 9.2 01/12/2023   HGBA1C 9.2 (A) 01/12/2023   HGBA1C 9.2 (A) 01/12/2023   Last thyroid  functions Lab Results  Component Value Date   TSH 0.80 03/28/2022   IMPRESSION AND PLAN:  #1 health maintenance exam: Reviewed age and gender appropriate health maintenance issues (prudent diet, regular exercise, health risks of tobacco and excessive alcohol, use of seatbelts, fire alarms in home, use of sunscreen).  Also reviewed age and gender appropriate health screening as well as vaccine recommendations. Vaccines: Shingrix-> advised her to go to HD for this since has [employer]. Labs: fasting HP + Hba1c Cervical ca screening: not a candidate due to hx of hysterectomy for benign dx. Breast ca screening: has been getting this through GYN MD Dr. Verlean in a long time-->mammogram ordered today. Colon ca screening: overdue->normal colonoscopy approx 2007 per pt report-->cologuard ordered today.  #2 hypertension, well-controlled on HCTZ 25 mg a day and lisinopril  20 mg a day. Electrolytes  and creatinine monitoring today.  3.  Hypercholesterolemia, doing well on atorvastatin  40 mg a day. Lipid panel and hepatic panel today.  4.  Adult ADD, stable on Adderall 20 mg twice a day. We are requesting her recent urine drug screen done by her orthopedist, Dr. Bonner.  #5 GAD.  Stable long-term on citalopram  20 mg a day and Xanax  0.5 mg twice daily as needed.  #6 diabetes without complication.  Significantly improved control. Point-of-care hemoglobin A1c is 6.8% today. I encouraged her to restart her pioglitazone  30 mg daily. She will also continue Ozempic  0.5 mg weekly. Feet exam normal today. Urine microalbumin/creatinine today.  #7 contact dermatitis. Improving on prednisone .  An After Visit Summary was printed and given to the patient.  FOLLOW UP: No follow-ups on file.  Signed:  Gerlene Hockey, MD           10/23/2023

## 2023-10-23 NOTE — Patient Instructions (Signed)

## 2023-10-24 ENCOUNTER — Ambulatory Visit: Payer: Self-pay | Admitting: Family Medicine

## 2023-10-24 DIAGNOSIS — D72829 Elevated white blood cell count, unspecified: Secondary | ICD-10-CM

## 2023-10-25 LAB — DM TEMPLATE

## 2023-10-26 LAB — DRUG MONITORING PANEL 376104, URINE
Barbiturates: NEGATIVE ng/mL (ref <300)
Benzodiazepines: NEGATIVE ng/mL (ref <100)
Cocaine Metabolite: NEGATIVE ng/mL (ref <150)
Codeine: NEGATIVE ng/mL (ref <50)
Desmethyltramadol: NEGATIVE ng/mL (ref <100)
Hydrocodone: NEGATIVE ng/mL (ref <50)
Hydromorphone: NEGATIVE ng/mL (ref <50)
Methamphetamine: NEGATIVE ng/mL (ref <250)
Morphine: NEGATIVE ng/mL (ref <50)
Norhydrocodone: NEGATIVE ng/mL (ref <50)
Noroxycodone: 8713 ng/mL — ABNORMAL HIGH (ref <50)
Opiates: NEGATIVE ng/mL (ref <100)
Oxycodone: >10000 ng/mL — ABNORMAL HIGH (ref <50)
Oxycodone: POSITIVE ng/mL — AB (ref <100)
Oxymorphone: 6975 ng/mL — ABNORMAL HIGH (ref <50)
Tramadol Comments: 6441 ng/mL — AB (ref <250)
Tramadol: NEGATIVE ng/mL (ref <100)
Tramadol: POSITIVE ng/mL — AB (ref <500)

## 2023-10-26 LAB — DM TEMPLATE

## 2023-11-07 ENCOUNTER — Other Ambulatory Visit: Payer: Self-pay | Admitting: Family Medicine

## 2023-11-18 LAB — COLOGUARD: COLOGUARD: NEGATIVE

## 2023-11-19 ENCOUNTER — Encounter: Payer: Self-pay | Admitting: Family Medicine

## 2023-11-28 ENCOUNTER — Other Ambulatory Visit: Payer: Self-pay

## 2023-11-28 MED ORDER — AMPHETAMINE-DEXTROAMPHETAMINE 20 MG PO TABS: 20.0000 mg | ORAL_TABLET | Freq: Two times a day (BID) | ORAL | 0 refills | Status: DC

## 2023-11-28 NOTE — Telephone Encounter (Signed)
 Requesting: adderall Contract: 10/23/23 UDS: 10/23/23 Last Visit: 10/23/23 Next Visit: 05/01/24 Last Refill: 10/23/23 (60,0) 30 d/s  Please Advise. Rx pending

## 2023-12-28 ENCOUNTER — Encounter: Payer: Self-pay | Admitting: Family Medicine

## 2023-12-29 MED ORDER — AMPHETAMINE-DEXTROAMPHETAMINE 20 MG PO TABS: 20.0000 mg | ORAL_TABLET | Freq: Two times a day (BID) | ORAL | 0 refills | Status: DC

## 2023-12-29 NOTE — Telephone Encounter (Signed)
 Rx sent.

## 2023-12-29 NOTE — Telephone Encounter (Signed)
 Requesting: adderall Contract:10/23/23 UDS: 10/23/23 Last Visit: 10/23/23 Next Visit: 05/01/24 Last Refill: 11/28/23 (60,0) 30 d/s  Please Advise. Rx pending

## 2024-01-24 MED ORDER — AMPHETAMINE-DEXTROAMPHETAMINE 20 MG PO TABS: 20.0000 mg | ORAL_TABLET | Freq: Two times a day (BID) | ORAL | 0 refills | Status: DC

## 2024-01-24 NOTE — Telephone Encounter (Signed)
 Requesting: Adderall  Contract: 10/23/23 UDS: 10/23/23 Last Visit: 10/23/23 Next Visit: 05/01/24 Last Refill: 12/29/23 (60,0)  Please Advise. Med pending

## 2024-02-07 ENCOUNTER — Other Ambulatory Visit: Payer: Self-pay | Admitting: Family Medicine

## 2024-02-29 ENCOUNTER — Encounter: Payer: Self-pay | Admitting: Family Medicine

## 2024-02-29 ENCOUNTER — Other Ambulatory Visit: Payer: Self-pay | Admitting: Nurse Practitioner

## 2024-02-29 DIAGNOSIS — L255 Unspecified contact dermatitis due to plants, except food: Secondary | ICD-10-CM

## 2024-03-01 MED ORDER — AMPHETAMINE-DEXTROAMPHETAMINE 20 MG PO TABS: 20.0000 mg | ORAL_TABLET | Freq: Two times a day (BID) | ORAL | 0 refills | Status: DC

## 2024-03-01 NOTE — Telephone Encounter (Signed)
 Requesting: Adderall Contract: 10/23/23 UDS: 10/23/23 Last Visit: 10/23/23 Next Visit:05/01/24 Last Refill: 01/24/24 (60,0)   RF request for Ozempic  LOV: 10/23/23 Next ov: 05/01/24 Last written: 11/07/23 (37ml,1)  Please fill, if appropriate. Based on last refill for Ozempic , pt should still have 1 refill remaining

## 2024-03-01 NOTE — Telephone Encounter (Signed)
 UC/VV appointment Rx- no longer under provider care Requested Prescriptions  Pending Prescriptions Disp Refills   hydrocortisone  2.5 % cream [Pharmacy Med Name: HYDROCORTISONE  2.5% CREAM] 453.6 g     Sig: APPLY TOPICALLY TWICE A DAY     Off-Protocol Failed - 03/01/2024  4:29 PM      Failed - Medication not assigned to a protocol, review manually.      Failed - Valid encounter within last 12 months    Recent Outpatient Visits           12 years ago Bilateral lower extremity edema   [facility] Care at [facility], [place] DEL, MD

## 2024-03-29 ENCOUNTER — Other Ambulatory Visit (HOSPITAL_COMMUNITY): Payer: Self-pay

## 2024-04-01 ENCOUNTER — Other Ambulatory Visit (HOSPITAL_COMMUNITY): Payer: Self-pay

## 2024-04-02 ENCOUNTER — Telehealth: Payer: Self-pay

## 2024-04-02 ENCOUNTER — Encounter: Payer: Self-pay | Admitting: Nurse Practitioner

## 2024-04-02 ENCOUNTER — Encounter: Payer: Self-pay | Admitting: Family Medicine

## 2024-04-02 NOTE — Telephone Encounter (Signed)
 Pharmacy Patient Advocate Encounter   Received notification from Onbase that prior authorization for Ozempic  (0.25 or 0.5 MG/DOSE) 2MG /3ML pen-injectors  is due for renewal.   Insurance verification completed.   The patient is insured through [employer].  Action: PA required; PA started via CoverMyMeds. KEY [id number] . Waiting for clinical questions to populate.

## 2024-04-02 NOTE — Telephone Encounter (Signed)
 Pharmacy Patient Advocate Encounter  Received notification from [employer] that Prior Authorization for  Ozempic  (0.25 or 0.5 MG/DOSE) 2MG /3ML pen-injectors  has been CANCELLED due to: No PA required

## 2024-04-03 NOTE — Telephone Encounter (Signed)
 Requesting: Adderall  Contract: 10/23/23 UDS: 10/23/23 Last Visit: 10/23/23 Next Visit: 05/01/24 Last Refill: 03/01/24  Please Advise. Rx pending

## 2024-04-04 NOTE — Telephone Encounter (Signed)
 Pt was advised PCP was sent medication renewal and is just waiting for response.

## 2024-04-05 MED ORDER — AMPHETAMINE-DEXTROAMPHETAMINE 20 MG PO TABS: 20.0000 mg | ORAL_TABLET | Freq: Two times a day (BID) | ORAL | 0 refills | Status: DC

## 2024-05-01 ENCOUNTER — Ambulatory Visit: Admitting: Family Medicine

## 2024-05-01 ENCOUNTER — Encounter: Payer: Self-pay | Admitting: Family Medicine

## 2024-05-01 NOTE — Progress Notes (Deleted)
 OFFICE VISIT  05/01/2024  CC: No chief complaint on file.   Patient is a 64 y.o. female who presents for 43-month follow-up hypertension, hypercholesterolemia, adult ADD, GAD, and diabetes. A/P as of last visit: #1 hypertension, well-controlled on HCTZ 25 mg a day and lisinopril  20 mg a day. Electrolytes and creatinine monitoring today.   2.  Hypercholesterolemia, doing well on atorvastatin  40 mg a day. Lipid panel and hepatic panel today.   3.  Adult ADD, stable on Adderall 20 mg twice a day. We are requesting her recent urine drug screen done by her orthopedist, Dr. Bonner.   #4 GAD.  Stable long-term on citalopram  20 mg a day and Xanax  0.5 mg twice daily as needed.   #5 diabetes without complication.  Significantly improved control. Point-of-care hemoglobin A1c is 6.8% today. I encouraged her to restart her pioglitazone  30 mg daily. She will also continue Ozempic  0.5 mg weekly. Feet exam normal today. Urine microalbumin/creatinine today.  INTERIM HX: ***  Past Medical History:  Diagnosis Date   Chronic neck pain    DDD/cervical spondylosis--C3-C6.  Mild central canal stenosis.   Chronic pain syndrome    pain mgmt by Dr. Bonner    Colon cancer screening    cologuard neg 10/2023   DDD (degenerative disc disease), lumbar    MRI 05/13/16: mild DDD at L3-4, and ruptured disc at L4-5 to the right.  Dr. Bonner recommended right L5 selective nerve root block + referral to Dr. Heide for surgery (as of 06/04/16 f/u).  As of 02/2018-->chr LBP lateralizing to R glut, pain meds are tx.   Diabetes mellitus without complication (HCC) 01/04/2016   Dx'd with elevated random gluc + HbA1c 6.6%.   Essential hypertension 09/05/2017   Started HCTZ 25mg  qd 09/05/17   GAD (generalized anxiety disorder)    with anger control problems   GERD (gastroesophageal reflux disease)    History of pneumonia 2010   with hemoptysis; Pulm eval done, f/u CXR 04/01/09 after pneumonia was normal.   History of  rheumatic fever    childhood--she reports no CV or Renal sequelae   Hyperlipidemia 12/2015   started statin 12/2015   IBS (irritable bowel syndrome)    Diarrhea predominant   Impingement syndrome of right shoulder    Pain in left knee    [facility]; MRI 09/2017--posterior horn medial meniscus tear (radial, not longitudinal).  Conservative rx as of 10/24/17 ortho f/u visit.   Polyarthralgia 2017   Rheum eval (Dr. Ishmael) 10/2015: no inflammatory arthritis suspected.   Tobacco abuse    30+ pack-yr hx   Venous insufficiency 01/2012    Past Surgical History:  Procedure Laterality Date   CHOLECYSTECTOMY  2011   COLONOSCOPY  approx 2007   Dr. Jakie (normal per pt--cannot locate path or procedure record)   ESOPHAGOGASTRODUODENOSCOPY        Mild esophagogastric junction inflammation, otherwise normal.   OVARY SURGERY  1993   left (ovarian cyst)   VAGINAL HYSTERECTOMY  2003   Nonmalignant reasons (DUB)    Outpatient Medications Prior to Visit  Medication Sig Dispense Refill   Accu-Chek Softclix Lancets lancets USE TO CHECK GLUCOSE ONCE DAILY 100 each 12   albuterol  (PROAIR  HFA) 108 (90 Base) MCG/ACT inhaler Inhale 1-2 puffs every 6 (six) hours as needed into the lungs for wheezing. (Patient not taking: Reported on 09/28/2020) 1 Inhaler 1   ALPRAZolam  (XANAX ) 0.5 MG tablet TAKE 1 TABLET BY MOUTH TWICE A DAY AS NEEDED FOR ANXIETY 30 tablet  5   aluminum chloride (DRYSOL) 20 % external solution Apply topically as needed.     amphetamine -dextroamphetamine  (ADDERALL) 20 MG tablet Take 1 tablet (20 mg total) by mouth 2 (two) times daily. 60 tablet 0   atorvastatin  (LIPITOR) 40 MG tablet Take 1 tablet (40 mg total) by mouth daily. 90 tablet 3   blood glucose meter kit and supplies KIT Dispense based on patient and insurance preference. Check glucose once daily 1 each 0   celecoxib (CELEBREX) 200 MG capsule Take 200 mg by mouth daily.     citalopram  (CELEXA ) 20 MG tablet TAKE 1 TABLET BY MOUTH  EVERY DAY 90 tablet 1   desloratadine  (CLARINEX ) 5 MG tablet Take 1 tablet (5 mg total) by mouth daily. 90 tablet 3   fluticasone  (FLONASE ) 50 MCG/ACT nasal spray SPRAY 2 SPRAYS INTO EACH NOSTRIL EVERY DAY 48 mL 1   glucose blood test strip USE TO CHECK GLUCOSE ONCE DAILY 100 each 12   hydrochlorothiazide  (HYDRODIURIL ) 25 MG tablet Take 1 tablet (25 mg total) by mouth daily. 90 tablet 3   hydrocortisone  2.5 % cream Apply topically 2 (two) times daily. 453 g 0   lisinopril  (ZESTRIL ) 20 MG tablet Take 1 tablet (20 mg total) by mouth daily. 90 tablet 3   metaxalone (SKELAXIN) 800 MG tablet Take 800 mg by mouth 3 (three) times daily as needed.     naloxone (NARCAN) nasal spray 4 mg/0.1 mL Place 1 spray into the nose once.     oxyCODONE-acetaminophen  (PERCOCET) 10-325 MG tablet   0   pioglitazone  (ACTOS ) 30 MG tablet Take 1 tablet (30 mg total) by mouth daily. 90 tablet 1   Semaglutide ,0.25 or 0.5MG /DOS, (OZEMPIC , 0.25 OR 0.5 MG/DOSE,) 2 MG/3ML SOPN INJECT 0.5 MG SUBCUTANEOUSLY EVERY 7 DAYS 9 mL 1   No facility-administered medications prior to visit.    Allergies[1]  Review of Systems As per HPI  PE:    10/23/2023    1:10 PM 02/14/2023   10:19 AM 01/12/2023    1:12 PM  Vitals with BMI  Height 5' 0.25  5' 0.25  Weight 171 lbs 3 oz 189 lbs 13 oz 192 lbs 6 oz  BMI 33.17  37.28  Systolic 133 118 896  Diastolic 84 74 71  Pulse 97 91 108     Physical Exam  ***  LABS:  {Labs (Optional):23779}  IMPRESSION AND PLAN:  No problem-specific Assessment & Plan notes found for this encounter.   An After Visit Summary was printed and given to the patient.  FOLLOW UP: No follow-ups on file. Next CPE June/July 2026 Signed:  Gerlene Hockey, MD           05/01/2024      [1]  Allergies Allergen Reactions   Metformin  And Related Nausea Only    GI Upset

## 2024-05-06 ENCOUNTER — Encounter: Payer: Self-pay | Admitting: Family Medicine

## 2024-05-07 NOTE — Telephone Encounter (Signed)
 I can't refill this. I have to see her every 6 months in the office (these are medical board/[place] laws I have to follow). She also no-showed her most recent f/u appt.

## 2024-05-08 NOTE — Telephone Encounter (Signed)
 LVM for pt to return call regarding scheduling appt or respond thru MyChart portal.

## 2024-05-09 NOTE — Telephone Encounter (Signed)
 Spoke with pt to schedule follow up, she was made aware we are unable to call in medication until her appt per PCP.

## 2024-05-17 ENCOUNTER — Encounter: Payer: Self-pay | Admitting: Family Medicine

## 2024-05-17 ENCOUNTER — Ambulatory Visit: Admitting: Family Medicine

## 2024-05-17 VITALS — BP 126/76 | HR 79 | Temp 98.4°F | Ht 60.25 in | Wt 164.6 lb

## 2024-05-17 DIAGNOSIS — Z79899 Other long term (current) drug therapy: Secondary | ICD-10-CM

## 2024-05-17 DIAGNOSIS — F988 Other specified behavioral and emotional disorders with onset usually occurring in childhood and adolescence: Secondary | ICD-10-CM

## 2024-05-17 DIAGNOSIS — E119 Type 2 diabetes mellitus without complications: Secondary | ICD-10-CM | POA: Diagnosis not present

## 2024-05-17 DIAGNOSIS — Z7984 Long term (current) use of oral hypoglycemic drugs: Secondary | ICD-10-CM | POA: Diagnosis not present

## 2024-05-17 DIAGNOSIS — F411 Generalized anxiety disorder: Secondary | ICD-10-CM

## 2024-05-17 DIAGNOSIS — I10 Essential (primary) hypertension: Secondary | ICD-10-CM | POA: Diagnosis not present

## 2024-05-17 LAB — POCT GLYCOSYLATED HEMOGLOBIN (HGB A1C)
HbA1c POC (<> result, manual entry): 6.3 %
HbA1c, POC (controlled diabetic range): 6.3 % (ref 0.0–7.0)
HbA1c, POC (prediabetic range): 6.3 % (ref 5.7–6.4)
Hemoglobin A1C: 6.3 % — AB (ref 4.0–5.6)

## 2024-05-17 MED ORDER — PIOGLITAZONE HCL 30 MG PO TABS: 30.0000 mg | ORAL_TABLET | Freq: Every day | ORAL | 3 refills | Status: AC

## 2024-05-17 MED ORDER — CITALOPRAM HYDROBROMIDE 20 MG PO TABS: 20.0000 mg | ORAL_TABLET | Freq: Every day | ORAL | 3 refills | Status: AC

## 2024-05-17 MED ORDER — AMPHETAMINE-DEXTROAMPHETAMINE 20 MG PO TABS: 20.0000 mg | ORAL_TABLET | Freq: Two times a day (BID) | ORAL | 0 refills | Status: AC

## 2024-05-17 MED ORDER — OZEMPIC (0.25 OR 0.5 MG/DOSE) 2 MG/3ML ~~LOC~~ SOPN: PEN_INJECTOR | SUBCUTANEOUS | 1 refills | Status: AC

## 2024-05-17 MED ORDER — DESLORATADINE 5 MG PO TABS: 5.0000 mg | ORAL_TABLET | Freq: Every day | ORAL | 3 refills | Status: AC

## 2024-05-17 MED ORDER — ALPRAZOLAM 0.5 MG PO TABS: ORAL_TABLET | ORAL | 5 refills | Status: AC

## 2024-05-17 NOTE — Progress Notes (Signed)
 OFFICE VISIT  05/17/2024  CC: No chief complaint on file.   Patient is a 64 y.o. female who presents for 17-month follow-up adult ADD, GAD, diabetes, and hypertension. A/P as of last visit: #1 hypertension, well-controlled on HCTZ 25 mg a day and lisinopril  20 mg a day. Electrolytes and creatinine monitoring today.   2.  Hypercholesterolemia, doing well on atorvastatin  40 mg a day. Lipid panel and hepatic panel today.   3.  Adult ADD, stable on Adderall 20 mg twice a day. We are requesting her recent urine drug screen done by her orthopedist, Dr. Bonner.   4. GAD.  Stable long-term on citalopram  20 mg a day and Xanax  0.5 mg twice daily as needed.   5. diabetes without complication.  Significantly improved control. Point-of-care hemoglobin A1c is 6.8% today. I encouraged her to restart her pioglitazone  30 mg daily. She will also continue Ozempic  0.5 mg weekly. Feet exam normal today. Urine microalbumin/creatinine today.   6. contact dermatitis. Improving on prednisone .  INTERIM HX: [name] is doing fine.  Mood and anxiety levels are stable. ADD: Pt states all is going well with the med at current dosing (Adderall 20 mg twice daily): much improved focus, concentration, task completion.  Less frustration, better multitasking, less impulsivity and restlessness.  Mood is stable. No side effects from the medication.  PMP AWARE reviewed today: most recent rx for Adderall 20 mg was filled 04/05/2024, # 60, rx by me.  Most recent alprazolam  prescription was filled 04/02/2024, #30, prescription by me. No red flags.  ROS --> no fevers, no CP, no SOB, no wheezing, no cough, no dizziness, no HAs, no rashes, no melena/hematochezia.  No polyuria or polydipsia.  No myalgias or arthralgias.  No focal weakness, paresthesias, or tremors.  No acute vision or hearing abnormalities.  No dysuria or unusual/new urinary urgency or frequency.  No recent changes in lower legs. No n/v/d or abd pain.  No  palpitations.    Past Medical History:  Diagnosis Date   Chronic neck pain    DDD/cervical spondylosis--C3-C6.  Mild central canal stenosis.   Chronic pain syndrome    pain mgmt by Dr. Bonner    Colon cancer screening    cologuard neg 10/2023   DDD (degenerative disc disease), lumbar    MRI 05/13/16: mild DDD at L3-4, and ruptured disc at L4-5 to the right.  Dr. Bonner recommended right L5 selective nerve root block + referral to Dr. Heide for surgery (as of 06/04/16 f/u).  As of 02/2018-->chr LBP lateralizing to R glut, pain meds are tx.   Diabetes mellitus without complication (HCC) 01/04/2016   Dx'd with elevated random gluc + HbA1c 6.6%.   Essential hypertension 09/05/2017   Started HCTZ 25mg  qd 09/05/17   GAD (generalized anxiety disorder)    with anger control problems   GERD (gastroesophageal reflux disease)    History of pneumonia 2010   with hemoptysis; Pulm eval done, f/u CXR 04/01/09 after pneumonia was normal.   History of rheumatic fever    childhood--she reports no CV or Renal sequelae   Hyperlipidemia 12/2015   started statin 12/2015   IBS (irritable bowel syndrome)    Diarrhea predominant   Impingement syndrome of right shoulder    Pain in left knee    [facility]; MRI 09/2017--posterior horn medial meniscus tear (radial, not longitudinal).  Conservative rx as of 10/24/17 ortho f/u visit.   Polyarthralgia 2017   Rheum eval (Dr. Ishmael) 10/2015: no inflammatory arthritis suspected.  Tobacco abuse    30+ pack-yr hx   Venous insufficiency 01/2012    Past Surgical History:  Procedure Laterality Date   CHOLECYSTECTOMY  2011   COLONOSCOPY  approx 2007   Dr. Jakie (normal per pt--cannot locate path or procedure record)   ESOPHAGOGASTRODUODENOSCOPY        Mild esophagogastric junction inflammation, otherwise normal.   OVARY SURGERY  1993   left (ovarian cyst)   VAGINAL HYSTERECTOMY  2003   Nonmalignant reasons (DUB)    Outpatient Medications Prior to Visit   Medication Sig Dispense Refill   Accu-Chek Softclix Lancets lancets USE TO CHECK GLUCOSE ONCE DAILY 100 each 12   atorvastatin  (LIPITOR) 40 MG tablet Take 1 tablet (40 mg total) by mouth daily. 90 tablet 3   blood glucose meter kit and supplies KIT Dispense based on patient and insurance preference. Check glucose once daily 1 each 0   celecoxib (CELEBREX) 200 MG capsule Take 200 mg by mouth daily.     fluticasone  (FLONASE ) 50 MCG/ACT nasal spray SPRAY 2 SPRAYS INTO EACH NOSTRIL EVERY DAY 48 mL 1   glucose blood test strip USE TO CHECK GLUCOSE ONCE DAILY 100 each 12   hydrochlorothiazide  (HYDRODIURIL ) 25 MG tablet Take 1 tablet (25 mg total) by mouth daily. 90 tablet 3   hydrocortisone  2.5 % cream Apply topically 2 (two) times daily. 453 g 0   lisinopril  (ZESTRIL ) 20 MG tablet Take 1 tablet (20 mg total) by mouth daily. 90 tablet 3   meloxicam (MOBIC) 15 MG tablet Take 15 mg by mouth daily.     oxyCODONE-acetaminophen  (PERCOCET) 10-325 MG tablet   0   desloratadine  (CLARINEX ) 5 MG tablet Take 1 tablet (5 mg total) by mouth daily. 90 tablet 3   Semaglutide ,0.25 or 0.5MG /DOS, (OZEMPIC , 0.25 OR 0.5 MG/DOSE,) 2 MG/3ML SOPN INJECT 0.5 MG SUBCUTANEOUSLY EVERY 7 DAYS 9 mL 1   albuterol  (PROAIR  HFA) 108 (90 Base) MCG/ACT inhaler Inhale 1-2 puffs every 6 (six) hours as needed into the lungs for wheezing. (Patient not taking: Reported on 05/17/2024) 1 Inhaler 1   naloxone (NARCAN) nasal spray 4 mg/0.1 mL Place 1 spray into the nose once. (Patient not taking: Reported on 05/17/2024)     ALPRAZolam  (XANAX ) 0.5 MG tablet TAKE 1 TABLET BY MOUTH TWICE A DAY AS NEEDED FOR ANXIETY 30 tablet 5   aluminum chloride (DRYSOL) 20 % external solution Apply topically as needed.     amphetamine -dextroamphetamine  (ADDERALL) 20 MG tablet Take 1 tablet (20 mg total) by mouth 2 (two) times daily. 60 tablet 0   citalopram  (CELEXA ) 20 MG tablet TAKE 1 TABLET BY MOUTH EVERY DAY 90 tablet 1   metaxalone (SKELAXIN) 800 MG tablet  Take 800 mg by mouth 3 (three) times daily as needed. (Patient not taking: Reported on 05/17/2024)     pioglitazone  (ACTOS ) 30 MG tablet Take 1 tablet (30 mg total) by mouth daily. 90 tablet 1   No facility-administered medications prior to visit.    Allergies[1]  Review of Systems As per HPI  PE:    05/17/2024    4:17 PM 10/23/2023    1:10 PM 02/14/2023   10:19 AM  Vitals with BMI  Height 5' 0.25 5' 0.25   Weight 164 lbs 10 oz 171 lbs 3 oz 189 lbs 13 oz  BMI 31.89 33.17   Systolic 126 133 881  Diastolic 76 84 74  Pulse 79 97 91    Physical Exam  Gen: Alert, well appearing.  Patient is oriented to person, place, time, and situation. AFFECT: pleasant, lucid thought and speech. No further exam today  LABS:  Last metabolic panel Lab Results  Component Value Date   GLUCOSE 239 (H) 10/23/2023   NA 138 10/23/2023   K 4.2 10/23/2023   CL 97 10/23/2023   CO2 33 (H) 10/23/2023   BUN 23 10/23/2023   CREATININE 0.71 10/23/2023   GFR 91.01 10/23/2023   CALCIUM  10.9 (H) 10/23/2023   PROT 7.7 10/23/2023   ALBUMIN 4.6 10/23/2023   BILITOT 0.3 10/23/2023   ALKPHOS 102 10/23/2023   AST 11 10/23/2023   ALT 31 10/23/2023   Last lipids Lab Results  Component Value Date   CHOL 190 10/23/2023   HDL 56.80 10/23/2023   LDLCALC 91 10/23/2023   LDLDIRECT 94.0 03/28/2022   TRIG 211.0 (H) 10/23/2023   CHOLHDL 3 10/23/2023   Last hemoglobin A1c Lab Results  Component Value Date   HGBA1C 6.3 (A) 05/17/2024   HGBA1C 6.3 05/17/2024   HGBA1C 6.3 05/17/2024   HGBA1C 6.3 05/17/2024   IMPRESSION AND PLAN:  #1 hypertension, well-controlled on HCTZ 25 mg a day and lisinopril  20 mg a day. Electrolytes and creatinine monitoring today.   2.  Hypercholesterolemia, doing well on atorvastatin  40 mg a day. LDL was 91 approximately 7 months ago. Repeat lipids when fasting in 6 months.   3.  Adult ADD, stable on Adderall 20 mg twice a day. We are requesting her recent urine drug  screen done by her orthopedist, Dr. Bonner.   4. GAD.  Stable long-term on citalopram  20 mg a day and Xanax  0.5 mg twice daily as needed.   5. diabetes without complication.  Significantly improved control. Point-of-care hemoglobin A1c is 6.3% today. Continue pioglitazone  30 mg a day and Ozempic  0.5 mg subcu weekly. Check renal function today.  An After Visit Summary was printed and given to the patient.  FOLLOW UP: Return in about 6 months (around 11/14/2024) for annual CPE (fasting).  Signed:  Gerlene Hockey, MD           05/17/2024     [1]  Allergies Allergen Reactions   Metformin  And Related Nausea Only    GI Upset

## 2024-05-18 LAB — BASIC METABOLIC PANEL WITH GFR
BUN: 18 mg/dL (ref 7–25)
CO2: 31 mmol/L (ref 20–32)
Calcium: 9.5 mg/dL (ref 8.6–10.4)
Chloride: 103 mmol/L (ref 98–110)
Creat: 0.7 mg/dL (ref 0.50–1.05)
Glucose, Bld: 114 mg/dL — ABNORMAL HIGH (ref 65–99)
Potassium: 4.2 mmol/L (ref 3.5–5.3)
Sodium: 141 mmol/L (ref 135–146)
eGFR: 97 mL/min/{1.73_m2}

## 2024-05-20 ENCOUNTER — Ambulatory Visit: Payer: Self-pay | Admitting: Family Medicine

## 2024-05-20 ENCOUNTER — Other Ambulatory Visit (HOSPITAL_COMMUNITY): Payer: Self-pay

## 2024-05-21 ENCOUNTER — Telehealth: Payer: Self-pay

## 2024-05-21 NOTE — Telephone Encounter (Signed)
 PA sent via covermymed on 05/21/24   Key: [id number]   Medication: Desloratidine   Dx: allergic rhinitis (J30.9)   Per Dr. McGowen pt has tried and failed N/A; nothing noted in chart   Waiting for response.   Update: [facility] reviewed your DESLORATADINE  5 MG TABLET request for the above-identified member, and it is denied for the following reason: because we did not see what we need to approve the drug you asked for, (desloratadine ). We may be able to approve this drug when you have tried other drugs first (two preferred drugs, such as over the counter loratadine tablet, over the counter cetirizine tablet, over the counter levocetirizine tablet).

## 2024-11-14 ENCOUNTER — Encounter: Admitting: Family Medicine
